# Patient Record
Sex: Female | Born: 1970
Health system: Southern US, Community
[De-identification: ages and names within clinical notes are randomized; demographics above are authoritative.]

## PROBLEM LIST (undated history)

## (undated) DIAGNOSIS — J3489 Other specified disorders of nose and nasal sinuses: Secondary | ICD-10-CM

## (undated) DIAGNOSIS — M797 Fibromyalgia: Secondary | ICD-10-CM

## (undated) DIAGNOSIS — K2270 Barrett's esophagus without dysplasia: Secondary | ICD-10-CM

## (undated) DIAGNOSIS — K2970 Gastritis, unspecified, without bleeding: Secondary | ICD-10-CM

## (undated) HISTORY — PX: WISDOM TOOTH EXTRACTION: SHX21

## (undated) HISTORY — PX: TONSILLECTOMY: SUR1361

## (undated) HISTORY — DX: Barrett's esophagus without dysplasia: K22.70

## (undated) HISTORY — DX: Fibromyalgia: M79.7

## (undated) HISTORY — DX: Other specified disorders of nose and nasal sinuses: J34.89

## (undated) HISTORY — PX: SEPTOPLASTY: SUR1290

## (undated) HISTORY — PX: HERNIA REPAIR: SHX51

---

## 2001-09-30 ENCOUNTER — Emergency Department (HOSPITAL_COMMUNITY): Admission: EM | Admit: 2001-09-30 | Discharge: 2001-10-01 | Payer: Self-pay | Admitting: Emergency Medicine

## 2003-04-08 ENCOUNTER — Emergency Department (HOSPITAL_COMMUNITY): Admission: AD | Admit: 2003-04-08 | Discharge: 2003-04-08 | Payer: Self-pay | Admitting: Emergency Medicine

## 2003-10-05 ENCOUNTER — Emergency Department (HOSPITAL_COMMUNITY): Admission: EM | Admit: 2003-10-05 | Discharge: 2003-10-05 | Payer: Self-pay | Admitting: Emergency Medicine

## 2003-12-20 ENCOUNTER — Emergency Department (HOSPITAL_COMMUNITY): Admission: EM | Admit: 2003-12-20 | Discharge: 2003-12-20 | Payer: Self-pay | Admitting: Emergency Medicine

## 2005-07-16 ENCOUNTER — Emergency Department (HOSPITAL_COMMUNITY): Admission: EM | Admit: 2005-07-16 | Discharge: 2005-07-16 | Payer: Self-pay | Admitting: Emergency Medicine

## 2005-07-19 ENCOUNTER — Encounter (HOSPITAL_COMMUNITY): Admission: RE | Admit: 2005-07-19 | Discharge: 2005-10-07 | Payer: Self-pay | Admitting: Emergency Medicine

## 2005-07-21 ENCOUNTER — Emergency Department (HOSPITAL_COMMUNITY): Admission: EM | Admit: 2005-07-21 | Discharge: 2005-07-21 | Payer: Self-pay | Admitting: Emergency Medicine

## 2005-11-12 ENCOUNTER — Ambulatory Visit (HOSPITAL_COMMUNITY): Admission: RE | Admit: 2005-11-12 | Discharge: 2005-11-12 | Payer: Self-pay | Admitting: *Deleted

## 2006-10-20 ENCOUNTER — Ambulatory Visit: Payer: Self-pay | Admitting: Psychiatry

## 2006-10-20 ENCOUNTER — Inpatient Hospital Stay (HOSPITAL_COMMUNITY): Admission: RE | Admit: 2006-10-20 | Discharge: 2006-10-25 | Payer: Self-pay | Admitting: Psychiatry

## 2006-11-05 ENCOUNTER — Inpatient Hospital Stay (HOSPITAL_COMMUNITY): Admission: AD | Admit: 2006-11-05 | Discharge: 2006-11-05 | Payer: Self-pay | Admitting: Family Medicine

## 2009-11-15 ENCOUNTER — Encounter: Admission: RE | Admit: 2009-11-15 | Discharge: 2009-11-15 | Payer: Self-pay | Admitting: Unknown Physician Specialty
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
## 2010-11-05 LAB — COMPREHENSIVE METABOLIC PANEL
BUN: 7
CO2: 30
Calcium: 9.5
GFR calc non Af Amer: >60
Glucose, Bld: 97
Total Protein: 6.9

## 2010-11-05 LAB — URINE DRUGS OF ABUSE SCREEN W ALC, ROUTINE (REF LAB)
Amphetamine Screen, Ur: NEGATIVE
Benzodiazepines.: NEGATIVE
Cocaine Metabolites: NEGATIVE
Ethyl Alcohol: <5
Marijuana Metabolite: NEGATIVE
Opiate Screen, Urine: NEGATIVE
Propoxyphene: NEGATIVE

## 2010-11-05 LAB — CBC
HCT: 33.1 — ABNORMAL LOW
HCT: 37.5
Hemoglobin: 11.6 — ABNORMAL LOW
Hemoglobin: 12.7
MCHC: 33.7
MCV: 96.3
RBC: 3.41 — ABNORMAL LOW
RBC: 3.9
RDW: 11.7
WBC: 5.5

## 2010-11-05 LAB — URINALYSIS, ROUTINE W REFLEX MICROSCOPIC
Bilirubin Urine: NEGATIVE
Glucose, UA: NEGATIVE
Hgb urine dipstick: NEGATIVE
Nitrite: NEGATIVE
Nitrite: NEGATIVE
Protein, ur: NEGATIVE
Protein, ur: NEGATIVE
Specific Gravity, Urine: 1.017
Urobilinogen, UA: 0.2
Urobilinogen, UA: 0.2

## 2010-11-05 LAB — GC/CHLAMYDIA PROBE AMP, GENITAL: Chlamydia, DNA Probe: NEGATIVE

## 2010-11-05 LAB — WET PREP, GENITAL

## 2010-11-05 LAB — PREGNANCY, URINE: Preg Test, Ur: NEGATIVE

## 2010-11-05 LAB — TSH: TSH: 0.603

## 2010-11-05 LAB — POCT PREGNANCY, URINE: Operator id: 202651

## 2011-01-26 ENCOUNTER — Ambulatory Visit: Payer: Self-pay

## 2011-01-26 DIAGNOSIS — R509 Fever, unspecified: Secondary | ICD-10-CM

## 2011-09-29 ENCOUNTER — Ambulatory Visit: Payer: Self-pay | Admitting: Physician Assistant

## 2011-09-29 VITALS — HR 94 | Temp 98.4°F | Resp 16 | Ht 59.0 in | Wt 102.0 lb

## 2011-09-29 DIAGNOSIS — J387 Other diseases of larynx: Secondary | ICD-10-CM

## 2011-09-29 DIAGNOSIS — J029 Acute pharyngitis, unspecified: Secondary | ICD-10-CM

## 2011-09-29 MED ORDER — DIPHENHYD-HYDROCORT-NYSTATIN MT SUSP: 5.0000 mL | Freq: Four times a day (QID) | OROMUCOSAL | Status: DC | PRN

## 2011-09-29 MED ORDER — AZITHROMYCIN 250 MG PO TABS: ORAL_TABLET | ORAL | Status: DC

## 2011-09-29 NOTE — Progress Notes (Signed)
Patient ID: Linda Mcbride MRN: [id number], DOB: 14-Nov-1970, 41 y.o. Date of Encounter: 09/29/2011, 3:40 PM  Primary Physician: No primary provider on file.  Chief Complaint:  Chief Complaint  Patient presents with  . Sore Throat    Started Monday  . Fever    HPI: 41 y.o. year old female presents with a 2 day history of sore throat. Subjective fever, chills, and myalgias. No cough, congestion, rhinorrhea, sinus pressure, otalgia, or headache. Normal hearing. No GI complaints. Able to swallow saliva, but hurts to do so. Decreased appetite secondary to sore throat. 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   No issues from her fibromyalgia. Has discontinued all of her medication.    Past Medical History  Diagnosis Date  . Fibromyalgia   . Perforated nasal septum      Home Meds: Prior to Admission medications   Not on File    Allergies:  Allergies  Allergen Reactions  . Penicillins Rash    History   Social History  . Marital Status: Single    Spouse Name: N/A    Number of Children: N/A  . Years of Education: N/A   Occupational History  . Not on file.   Social History Main Topics  . Smoking status: Never Smoker   . Smokeless tobacco: Not on file  . Alcohol Use: Not on file  . Drug Use: Not on file  . Sexually Active: Not on file   Other Topics Concern  . Not on file   Social History Narrative  . No narrative on file     Review of Systems: Constitutional: negative for night sweats or weight changes HEENT: see above Cardiovascular: negative for chest pain or palpitations Respiratory: negative for hemoptysis, wheezing, or shortness of breath Abdominal: negative for abdominal pain, nausea, vomiting or diarrhea Dermatological: negative for rash Neurologic: negative for headache   Physical Exam: Pulse 94, temperature 98.4 F (36.9 C), resp. rate 16, height 4\' 11"  (1.499 m), weight 102 lb (46.267 kg), last menstrual period 09/15/2011., Body mass  index is 20.60 kg/(m^2). Patient declines BP stating it causes a flare of her fibromyalgia. General: Well developed, well nourished, in no acute distress. Head: Normocephalic, atraumatic, eyes without discharge, sclera non-icteric, nares are patent. Bilateral auditory canals clear, TM's are without perforation, pearly grey with reflective cone of light bilaterally. No sinus TTP. Oral cavity moist, dentition normal. Posterior pharynx with post nasal drip, mild erythema, and an isolated ulcer along the right side without signs of secondary infection. No peritonsillar abscess or tonsillar exudate. Neck: Supple. No thyromegaly. Full ROM. Less than two cm AC. Lungs: Clear bilaterally to auscultation without wheezes, rales, or rhonchi. Breathing is unlabored. Heart: RRR with S1 S2. No murmurs, rubs, or gallops appreciated. Msk:  Strength and tone normal for age. Extremities: No clubbing or cyanosis. No edema. Neuro: Alert and oriented X 3. Moves all extremities spontaneously. CNII-XII grossly in tact. Psych:  Responds to questions appropriately with a normal affect.   Labs: Results for orders placed in visit on 09/29/11  POCT RAPID STREP A (OFFICE)      Component Value Range   Rapid Strep A Screen Negative  Negative     ASSESSMENT AND PLAN:  41 y.o. year old female with ulcerative pharyngitis. -Azithromycin 250 MG #6 2 po first day then 1 po next 4 days no RF -[employer] Magic Mouthwash #120 mL 1 tsp po q 4-6 hours prn sore throat no RF -New tooth brush -  Tylenol/Motrin prn -Rest/fluids -RTC precautions -RTC 3-5 days if no improvement  Signed, Eula Listen, PA-C 09/29/2011 3:40 PM

## 2012-04-09 ENCOUNTER — Ambulatory Visit: Payer: Self-pay | Admitting: Family Medicine

## 2012-04-09 VITALS — HR 84 | Temp 98.2°F | Resp 16 | Ht 59.0 in | Wt 101.0 lb

## 2012-04-09 DIAGNOSIS — J019 Acute sinusitis, unspecified: Secondary | ICD-10-CM

## 2012-04-09 DIAGNOSIS — J387 Other diseases of larynx: Secondary | ICD-10-CM

## 2012-04-09 DIAGNOSIS — J029 Acute pharyngitis, unspecified: Secondary | ICD-10-CM

## 2012-04-09 MED ORDER — AZITHROMYCIN 250 MG PO TABS: ORAL_TABLET | ORAL | Status: AC

## 2012-04-09 NOTE — Progress Notes (Signed)
Patient ID: Linda Mcbride MRN: [id number], DOB: [birth year], 42 y.o. Date of Encounter: 04/09/2012, 3:05 PM  Primary Physician: No primary provider on file.  Chief Complaint:  Chief Complaint  Patient presents with  . Sinusitis    * about 1 month- Pt took Clindamyacin for 1 week- Dx at [facility]    HPI: 42 y.o. year old female presents with 30 day history of nasal congestion, post nasal drip, sore throat, sinus pressure, and cough. Afebrile. No chills. Nasal congestion thick and green/yellow. Sinus pressure is the worst symptom. Cough is productive secondary to post nasal drip and not associated with time of day. Ears feel full, leading to sensation of muffled hearing. Has tried OTC cold preps without success. No GI complaints.  No recent antibiotics, recent travels, or sick contacts   No leg trauma, sedentary periods, h/o cancer, or tobacco use.  Past Medical History  Diagnosis Date  . Fibromyalgia   . Perforated nasal septum      Home Meds: Prior to Admission medications   Medication Sig Start Date End Date Taking? Authorizing Provider  azithromycin (ZITHROMAX Z-PAK) 250 MG tablet 2 tabs po first day, then 1 tab po next 4 days 04/09/12 04/14/12  Elvina Sidle, MD  Diphenhyd-Hydrocort-Nystatin SUSP Use as directed 5 mLs in the mouth or throat 4 (four) times daily as needed (sore throat). 09/29/11   Sondra Barges, PA-C    Allergies:  Allergies  Allergen Reactions  . Sulfa Antibiotics Itching and Nausea And Vomiting  . Penicillins Rash    History   Social History  . Marital Status: Single    Spouse Name: N/A    Number of Children: N/A  . Years of Education: N/A   Occupational History  . Not on file.   Social History Main Topics  . Smoking status: Never Smoker   . Smokeless tobacco: Not on file  . Alcohol Use: No  . Drug Use: No  . Sexually Active: Not on file   Other Topics Concern  . Not on file   Social History Narrative  . No narrative on file      Review of Systems: Constitutional: negative for chills, fever, night sweats or weight changes Cardiovascular: negative for chest pain or palpitations Respiratory: negative for hemoptysis, wheezing, or shortness of breath Abdominal: negative for abdominal pain, nausea, vomiting or diarrhea Dermatological: negative for rash Neurologic: negative for headache   Physical Exam: Blood pressure , pulse 84, temperature 98.2 F (36.8 C), temperature source Oral, resp. rate 16, height 4\' 11"  (1.499 m), weight 101 lb (45.813 kg), last menstrual period 04/02/2012, SpO2 96.00%., Body mass index is 20.39 kg/(m^2). General: Well developed, well nourished, in no acute distress. Head: Normocephalic, atraumatic, eyes without discharge, sclera non-icteric, nares are congested. Bilateral auditory canals clear, TM's are without perforation, pearly grey with reflective cone of light bilaterally. Serous effusion bilaterally behind TM's. Maxillary sinus TTP. Oral cavity moist, dentition normal. Posterior pharynx with post nasal drip and mild erythema. No peritonsillar abscess or tonsillar exudate. Neck: Supple. No thyromegaly. Full ROM. No lymphadenopathy. Lungs: Clear bilaterally to auscultation without wheezes, rales, or rhonchi. Breathing is unlabored.  Heart: RRR with S1 S2. No murmurs, rubs, or gallops appreciated. Msk:  Strength and tone normal for age. Extremities: No clubbing or cyanosis. No edema. Neuro: Alert and oriented X 3. Moves all extremities spontaneously. CNII-XII grossly in tact. Psych:  Responds to questions appropriately with a normal affect.   Labs:   ASSESSMENT AND PLAN:  42 y.o. year old female with sinusitis  Acute sinusitis - Plan: azithromycin (ZITHROMAX Z-PAK) 250 MG tablet   .  -  -Tylenol/Motrin prn -Rest/fluids -RTC precautions -RTC 3-5 days if no improvement  Signed, Elvina Sidle, MD 04/09/2012 3:05 PM

## 2013-03-23 ENCOUNTER — Ambulatory Visit: Payer: Self-pay | Admitting: Physician Assistant

## 2013-03-23 VITALS — BP 122/74 | HR 71 | Temp 98.4°F | Resp 17 | Ht 60.5 in | Wt 107.0 lb

## 2013-03-23 DIAGNOSIS — R309 Painful micturition, unspecified: Secondary | ICD-10-CM

## 2013-03-23 DIAGNOSIS — R300 Dysuria: Secondary | ICD-10-CM

## 2013-03-23 LAB — POCT UA - MICROSCOPIC ONLY
Casts, Ur, LPF, POC: NEGATIVE
Crystals, Ur, HPF, POC: NEGATIVE
Epithelial cells, urine per micros: NEGATIVE
Mucus, UA: POSITIVE
Yeast, UA: NEGATIVE

## 2013-03-23 LAB — POCT URINALYSIS DIPSTICK
BILIRUBIN UA: NEGATIVE
GLUCOSE UA: NEGATIVE
KETONES UA: NEGATIVE
Nitrite, UA: NEGATIVE
Protein, UA: NEGATIVE
SPEC GRAV UA: 1.015
Urobilinogen, UA: 0.2
pH, UA: 8.5

## 2013-03-23 MED ORDER — CIPROFLOXACIN HCL 250 MG PO TABS: 250.0000 mg | ORAL_TABLET | Freq: Two times a day (BID) | ORAL | Status: AC

## 2013-03-23 NOTE — Progress Notes (Signed)
Subjective:    Patient ID: Linda Mcbride, female    DOB: 1970/07/31, 43 y.o.   MRN: [id number]  HPI Patient reports with one day history of sharp stabbing suprapubic pain.  She reports the pain started last night and is getting worse.  The pain woke her up during the night.  She states the pain is constant.  She reports trouble getting her stream to start, having to urinate frequently, feeling like her bladder is not emptying.  She reports a strong urine odor for 2 days and states she has not been able to urinate as frequently as needed this week due to work.  She denies any vaginal discharge, odor or burning.   Review of Systems  Constitutional: Positive for fatigue (due to lack of sleep last night). Negative for fever and chills.  Gastrointestinal: Positive for abdominal pain (suprapubic pain, sharp/stabbing, constant). Negative for nausea, vomiting and abdominal distention.  Genitourinary: Positive for urgency, frequency and difficulty urinating. Negative for dysuria, flank pain, decreased urine volume, vaginal bleeding, vaginal discharge, vaginal pain and menstrual problem.     Objective:   Physical Exam  Constitutional: She is oriented to person, place, and time. She appears well-developed and well-nourished. No distress.  HENT:  Head: Normocephalic and atraumatic.  Eyes: Conjunctivae and EOM are normal. Pupils are equal, round, and reactive to light. Right eye exhibits no discharge. Left eye exhibits no discharge. No scleral icterus.  Neck: Normal range of motion.  Cardiovascular: Normal rate, regular rhythm and normal heart sounds.  Exam reveals no gallop and no friction rub.   No murmur heard. Pulmonary/Chest: Effort normal and breath sounds normal.  Abdominal: Soft. Bowel sounds are normal. She exhibits no distension. There is tenderness (suprapubic plus mild diffuse abdominal). There is no CVA tenderness.  Neurological: She is alert and oriented to person, place, and time.  Skin:  Skin is warm and dry.  Psychiatric: She has a normal mood and affect. Her behavior is normal. Judgment and thought content normal.      Results for orders placed in visit on 03/23/13  POCT UA - MICROSCOPIC ONLY      Result Value Ref Range   WBC, Ur, HPF, POC 7-10     RBC, urine, microscopic 1-3     Bacteria, U Microscopic trace     Mucus, UA pos     Epithelial cells, urine per micros neg     Crystals, Ur, HPF, POC neg     Casts, Ur, LPF, POC neg     Yeast, UA neg    POCT URINALYSIS DIPSTICK      Result Value Ref Range   Color, UA light yellow     Clarity, UA clear     Glucose, UA neg     Bilirubin, UA neg     Ketones, UA neg     Spec Grav, UA 1.015     Blood, UA small     pH, UA 8.5     Protein, UA neg     Urobilinogen, UA 0.2     Nitrite, UA neg     Leukocytes, UA small (1+)      Assessment & Plan:   1. Dysuria - Suspect UTI, early, empiric therapy initiated, will await culture  - POCT UA - Microscopic Only - POCT urinalysis dipstick - Urine culture - ciprofloxacin (CIPRO) 250 MG tablet; Take 1 tablet (250 mg total) by mouth 2 (two) times daily.  Dispense: 10 tablet; Refill: 0

## 2013-03-23 NOTE — Patient Instructions (Signed)
Urinary Tract Infection  Urinary tract infections (UTIs) can develop anywhere along your urinary tract. Your urinary tract is your body's drainage system for removing wastes and extra water. Your urinary tract includes two kidneys, two ureters, a bladder, and a urethra. Your kidneys are a pair of bean-shaped organs. Each kidney is about the size of your fist. They are located below your ribs, one on each side of your spine.  CAUSES  Infections are caused by microbes, which are microscopic organisms, including fungi, viruses, and bacteria. These organisms are so small that they can only be seen through a microscope. Bacteria are the microbes that most commonly cause UTIs.  SYMPTOMS   Symptoms of UTIs may vary by age and gender of the patient and by the location of the infection. Symptoms in young women typically include a frequent and intense urge to urinate and a painful, burning feeling in the bladder or urethra during urination. Older women and men are more likely to be tired, shaky, and weak and have muscle aches and abdominal pain. A fever may mean the infection is in your kidneys. Other symptoms of a kidney infection include pain in your back or sides below the ribs, nausea, and vomiting.  DIAGNOSIS  To diagnose a UTI, your caregiver will ask you about your symptoms. Your caregiver also will ask to provide a urine sample. The urine sample will be tested for bacteria and white blood cells. White blood cells are made by your body to help fight infection.  TREATMENT   Typically, UTIs can be treated with medication. Because most UTIs are caused by a bacterial infection, they usually can be treated with the use of antibiotics. The choice of antibiotic and length of treatment depend on your symptoms and the type of bacteria causing your infection.  HOME CARE INSTRUCTIONS   If you were prescribed antibiotics, take them exactly as your caregiver instructs you. Finish the medication even if you feel better after you  have only taken some of the medication.   Drink enough water and fluids to keep your urine clear or pale yellow.   Avoid caffeine, tea, and carbonated beverages. They tend to irritate your bladder.   Empty your bladder often. Avoid holding urine for long periods of time.   Empty your bladder before and after sexual intercourse.   After a bowel movement, women should cleanse from front to back. Use each tissue only once.  SEEK MEDICAL CARE IF:    You have back pain.   You develop a fever.   Your symptoms do not begin to resolve within 3 days.  SEEK IMMEDIATE MEDICAL CARE IF:    You have severe back pain or lower abdominal pain.   You develop chills.   You have nausea or vomiting.   You have continued burning or discomfort with urination.  MAKE SURE YOU:    Understand these instructions.   Will watch your condition.   Will get help right away if you are not doing well or get worse.  Document Released: 10/21/2004 Document Revised: 07/13/2011 Document Reviewed: 02/19/2011  Exit[employer] Patient Information 2014 [employer].

## 2013-03-23 NOTE — Progress Notes (Signed)
I have examined this patient along with the student and agree.  

## 2013-03-26 LAB — URINE CULTURE

## 2013-06-08 ENCOUNTER — Ambulatory Visit: Payer: Self-pay | Admitting: Family Medicine

## 2013-06-08 VITALS — BP 108/70 | HR 73 | Temp 97.9°F | Resp 16

## 2013-06-08 DIAGNOSIS — H9209 Otalgia, unspecified ear: Secondary | ICD-10-CM

## 2013-06-08 DIAGNOSIS — Z831 Family history of other infectious and parasitic diseases: Secondary | ICD-10-CM

## 2013-06-08 DIAGNOSIS — R87619 Unspecified abnormal cytological findings in specimens from cervix uteri: Secondary | ICD-10-CM

## 2013-06-08 DIAGNOSIS — N949 Unspecified condition associated with female genital organs and menstrual cycle: Secondary | ICD-10-CM

## 2013-06-08 DIAGNOSIS — Z124 Encounter for screening for malignant neoplasm of cervix: Secondary | ICD-10-CM

## 2013-06-08 DIAGNOSIS — R102 Pelvic and perineal pain: Secondary | ICD-10-CM

## 2013-06-08 LAB — POCT URINALYSIS DIPSTICK
BILIRUBIN UA: NEGATIVE
Blood, UA: NEGATIVE
Glucose, UA: NEGATIVE
KETONES UA: NEGATIVE
LEUKOCYTES UA: NEGATIVE
NITRITE UA: NEGATIVE
PROTEIN UA: NEGATIVE
Spec Grav, UA: 1.015
Urobilinogen, UA: 0.2
pH, UA: 7.5

## 2013-06-08 LAB — POCT UA - MICROSCOPIC ONLY
Bacteria, U Microscopic: NEGATIVE
Casts, Ur, LPF, POC: NEGATIVE
Crystals, Ur, HPF, POC: NEGATIVE
Mucus, UA: NEGATIVE
RBC, URINE, MICROSCOPIC: NEGATIVE
WBC, UR, HPF, POC: NEGATIVE
Yeast, UA: NEGATIVE

## 2013-06-08 MED ORDER — ACYCLOVIR 400 MG PO TABS: 400.0000 mg | ORAL_TABLET | Freq: Three times a day (TID) | ORAL | Status: DC

## 2013-06-08 NOTE — Patient Instructions (Signed)
You can use the acyclovir as needed for genital herpes symptoms.   I will be in touch with your pap when it comes in Please try OTC medications for your cold- such as ibuprofen, mucinex, tylenol.    Please let me know if you have any other problems or are feeling worse.

## 2013-06-08 NOTE — Progress Notes (Addendum)
[facility] [address], [place] [address] [phone]  Date:  06/08/2013   Name:  Linda DukesMayumi Mcbride   DOB:  [birth year]   MRN:  [id number]  PCP:  No primary provider on file.    Chief Complaint: Urinary Frequency, Otalgia and Herpes Zoster   History of Present Illness:  Linda Mcbride is a 43 y.o. very pleasant female patient who presents with the following:  Last week she noted pain "in the pelvic area."  She notes this pain "all the time," and also with urination. She also notes a current genital HSV outbreak.  She has noted the outbreak towards the end of last week when she had her menses. Notes that she tends to "get sores with wearing pads."  States she was dx with HSV some years ago and noted typical sores recently.   She also notes "plugged ears, dizziness, ST, some coughing in the morning" for about 4 days.   She has not noted a fever. No GI symptoms.  However she had a hard time sleeping last night because she "felt hot" Her menses are just ending.    There are no active problems to display for this patient.   Past Medical History  Diagnosis Date  . Fibromyalgia   . Perforated nasal septum     Past Surgical History  Procedure Laterality Date  . Septoplasty    . Hernia repair      History  Substance Use Topics  . Smoking status: Never Smoker   . Smokeless tobacco: Not on file  . Alcohol Use: No    No family history on file.  Allergies  Allergen Reactions  . Clindamycin/Lincomycin Itching  . Sulfa Antibiotics Itching and Nausea And Vomiting  . Penicillins Rash    Medication list has been reviewed and updated.  No current outpatient prescriptions on file prior to visit.   No current facility-administered medications on file prior to visit.    Review of Systems:  As per HPI- otherwise negative.   Physical Examination: Filed Vitals:   06/08/13 0908  BP: 108/70  Pulse: 73  Temp: 97.9 F (36.6 C)  Resp: 16   Filed  Vitals:   Body mass index is 0.00 kg/(m^2). Ideal Body Weight:    GEN: WDWN, NAD, Non-toxic, A & O x 3, looks well.   HEENT: Atraumatic, Normocephalic. Neck supple. No masses, No LAD.  Bilateral TM wnl, oropharynx normal.  PEERL,EOMI. She has a nasal septum perforation  Ears and Nose: No external deformity. CV: RRR, No M/G/R. No JVD. No thrill. No extra heart sounds. PULM: CTA B, no wheezes, crackles, rhonchi. No retractions. No resp. distress. No accessory muscle use. ABD: S, NT, ND. No rebound. No HSM. EXTR: No c/c/e NEURO Normal gait.  PSYCH: Normally interactive. Conversant. Somewhat unusual affect.  Chart reviews reveals history of a psychotic disorder NOS with hospitalization in the pasts Gu: normal exam.  No masses, lesions or vaginal discharge. No evidence of HSV lesions on the vulva, vagina, perineum or buttocks.  No CMT, no adnexal masses or tenderness   Results for orders placed in visit on 06/08/13  POCT UA - MICROSCOPIC ONLY      Result Value Ref Range   WBC, Ur, HPF, POC neg     RBC, urine, microscopic neg     Bacteria, U Microscopic neg     Mucus, UA neg     Epithelial cells, urine per micros 0-1     Crystals, Ur,  HPF, POC neg     Casts, Ur, LPF, POC neg     Yeast, UA neg    POCT URINALYSIS DIPSTICK      Result Value Ref Range   Color, UA yellow     Clarity, UA clear     Glucose, UA neg     Bilirubin, UA neg     Ketones, UA neg     Spec Grav, UA 1.015     Blood, UA neg     pH, UA 7.5     Protein, UA neg     Urobilinogen, UA 0.2     Nitrite, UA neg     Leukocytes, UA Negative     Assessment and Plan: Screening for cervical cancer - Plan: Pap IG, CT/NG w/ reflex HPV when ASC-U  Pelvic pain in female - Plan: POCT UA - Microscopic Only, POCT urinalysis dipstick  Ear pain  Family history of herpes genitalis - Plan: acyclovir (ZOVIRAX) 400 MG tablet  Linda Mcbride is here today with a few concerns.  Reassured that I actually do not see any evidence of an active  HSV outbreak.  However she may certainly use acyclovir for 5 days if she would like.  Await pap #3.   Otherwise she seems to have a viral URI.  Encouraged her to use OTC medications as needed, and to let me know if not feeling better soon.    Signed Abbe AmsterdamJessica Kanda Deluna, MD  Called her on 5/20- let her know she has an abnormal pap and needs further eval per OBG.  I will make this referral for her, she will let me know if she does not hear

## 2013-06-12 LAB — PAP IG, CT-NG, RFX HPV ASCU
Chlamydia Probe Amp: NEGATIVE
GC Probe Amp: NEGATIVE

## 2013-06-13 ENCOUNTER — Encounter: Payer: Self-pay | Admitting: Family Medicine

## 2013-06-13 NOTE — Addendum Note (Signed)
Addended by: Abbe AmsterdamOPLAND, JESSICA C on: 06/13/2013 12:41 PM   Modules accepted: Orders

## 2013-07-20 ENCOUNTER — Encounter: Payer: Self-pay | Admitting: Obstetrics & Gynecology

## 2013-08-30 ENCOUNTER — Encounter: Payer: Self-pay | Admitting: Obstetrics & Gynecology

## 2013-08-30 ENCOUNTER — Other Ambulatory Visit: Payer: Self-pay | Admitting: Obstetrics and Gynecology

## 2013-08-30 DIAGNOSIS — Z1231 Encounter for screening mammogram for malignant neoplasm of breast: Secondary | ICD-10-CM

## 2013-09-04 ENCOUNTER — Ambulatory Visit (HOSPITAL_COMMUNITY)
Admission: RE | Admit: 2013-09-04 | Discharge: 2013-09-04 | Disposition: A | Discharge status: Home / Self Care | Payer: Self-pay | Source: Ambulatory Visit | Attending: Obstetrics and Gynecology | Admitting: Obstetrics and Gynecology

## 2013-09-04 ENCOUNTER — Encounter (HOSPITAL_COMMUNITY): Payer: Self-pay

## 2013-09-04 VITALS — BP 110/68 | Temp 98.8°F | Ht 59.0 in | Wt 103.2 lb

## 2013-09-04 DIAGNOSIS — Z1239 Encounter for other screening for malignant neoplasm of breast: Secondary | ICD-10-CM

## 2013-09-04 DIAGNOSIS — Z1231 Encounter for screening mammogram for malignant neoplasm of breast: Secondary | ICD-10-CM

## 2013-09-04 DIAGNOSIS — R87612 Low grade squamous intraepithelial lesion on cytologic smear of cervix (LGSIL): Secondary | ICD-10-CM | POA: Insufficient documentation

## 2013-09-04 NOTE — Patient Instructions (Signed)
Linda Mcbride did not need a Pap smear today due to last Pap smear was 06/08/2013. [facility] for col[facility] to follow-up for abnormal Pap smear. Appointment scheduled for Thursday, October 18, 2013 at 1315. Patient aware of appointment and will be there. Let patient know the [facility] will follow up with her within the next couple weeks with results by letter or phone. Linda Mcbride verbalized understanding. Patient escorted to mammography for a screening mammogram.  Frederick Klinger, Kathaleen Maserhristine Poll, RN 3:54 PM

## 2013-09-04 NOTE — Progress Notes (Signed)
Patient referred to [facility] by the [facility] due to recommending a colposcopy for an abnormal Pap smear on 06/08/2013.  Pap Smear:  Pap smear not completed today. Last Pap smear was 06/08/2013 at [facility]. [facility] to follow-up for abnormal Pap smear. Appointment scheduled for Thursday, October 18, 2013 at 1315. Per patient this is her first abnormal Pap smear. Last Pap smear result is in EPIC.  Physical exam: Breasts Breasts symmetrical. No skin abnormalities bilateral breasts. No nipple retraction bilateral breasts. No nipple discharge bilateral breasts. No lymphadenopathy. No lumps palpated bilateral breasts. No complaints of pain or tenderness on exam. Patient escorted to mammography for a screening mammogram.        Pelvic/Bimanual No Pap smear completed today since last Pap smear was 06/08/2013. Pap smear not indicated per [facility]CP guidelines.

## 2013-10-18 ENCOUNTER — Encounter: Payer: Self-pay | Admitting: Family Medicine

## 2013-10-19 ENCOUNTER — Ambulatory Visit: Payer: Self-pay | Attending: Internal Medicine

## 2013-12-07 ENCOUNTER — Encounter: Payer: Self-pay | Admitting: Nurse Practitioner

## 2014-01-02 ENCOUNTER — Encounter: Payer: Self-pay | Admitting: Family Medicine

## 2014-01-02 ENCOUNTER — Ambulatory Visit (INDEPENDENT_AMBULATORY_CARE_PROVIDER_SITE_OTHER): Payer: Self-pay | Admitting: Family Medicine

## 2014-01-02 VITALS — BP 88/58 | HR 86 | Temp 98.8°F | Resp 16 | Ht 59.5 in | Wt 101.0 lb

## 2014-01-02 DIAGNOSIS — J0100 Acute maxillary sinusitis, unspecified: Secondary | ICD-10-CM

## 2014-01-02 MED ORDER — AZITHROMYCIN 250 MG PO TABS: ORAL_TABLET | ORAL | Status: DC

## 2014-01-02 NOTE — Progress Notes (Signed)
   Subjective:    Patient ID: Linda DukesMayumi Torrance, female    DOB: [birth year], 43 y.o.   MRN: [id number]  HPI This is a 43 yo female accompanied by her husband. The patient presents today with 3 week history of intermittent upper respiratory symptoms. Started as sore throat, fever, muscle aches. Felt better after a few days. The next week, got better, then became sick again. One week ago, she began having yellow nasal drainage with blood. She has a non productive cough with tightness. She has a sinus headache over and below her left eye and ear pressure. She reports having a sinus infection every year and having good results with Zithromax. She is unable to tolerate any nasal sprays other than saline. She can not tolerate Mucinex due to coloring.  Lots of people at their workplace have been sick with similar symptoms in a similar recurring pattern.   The patient reports multiple intolerances to dyes, disinfectants and additives in medication. She has been taking an over the counter natural decongestant that she purchased at [employer] with some relief. Does not feel that analgesics work for her pain, therefore, she has not taken any ibuprofen or acetaminophen.   Review of Systems No recent fever, no disrupted sleep, no wheezing, No SOB, feels unable to take a deep breath, has sinus headache, left ear pressure,     Objective:   Physical Exam  Constitutional: She is oriented to person, place, and time. She appears well-developed and well-nourished. No distress.  HENT:  Head: Normocephalic and atraumatic.  Right Ear: Tympanic membrane, external ear and ear canal normal.  Left Ear: Tympanic membrane, external ear and ear canal normal.  Nose: Mucosal edema and rhinorrhea present. Right sinus exhibits maxillary sinus tenderness. Right sinus exhibits no frontal sinus tenderness. Left sinus exhibits maxillary sinus tenderness and frontal sinus tenderness.  Mouth/Throat: Uvula is midline and mucous membranes are  normal. Posterior oropharyngeal erythema present.  Thick post nasal drainage.  Eyes: Conjunctivae are normal. Pupils are equal, round, and reactive to light.  Neck: Normal range of motion. Neck supple.  Cardiovascular: Normal rate, regular rhythm and normal heart sounds.   Pulmonary/Chest: Effort normal and breath sounds normal. No respiratory distress. She has no wheezes. She has no rales. She exhibits no tenderness.  Musculoskeletal: Normal range of motion.  Neurological: She is alert and oriented to person, place, and time.  Skin: Skin is warm and dry. She is not diaphoretic.  Psychiatric: She has a normal mood and affect. Her behavior is normal. Judgment and thought content normal.  Vitals reviewed. BP 88/58 mmHg  Pulse 86  Temp(Src) 98.8 F (37.1 C) (Oral)  Resp 16  Ht 4' 11.5" (1.511 m)  Wt 101 lb (45.813 kg)  BMI 20.07 kg/m2  SpO2 98%  LMP 12/16/2013 Repeat BP- 102/62    Assessment & Plan:  1. Acute maxillary sinusitis, recurrence not specified - azithromycin (ZITHROMAX) 250 MG tablet; Take 2 tablets today, then 1 every day until finished.  Dispense: 6 tablet; Refill: 0 -Provided written and verbal information regarding diagnosis and treatment. - Patient very limited in what she can take for symptomatic treatment due to her intolerances. Recommended she continue OTC decongestant, saline nasal spray, increase fluids, warm compresses to face. - RTC if worsening symptoms or if not improved with antibiotic Emi Belfasteborah B. Gessner, FNP-BC  [facility]  01/02/2014 3:30 PM

## 2014-01-02 NOTE — Patient Instructions (Signed)
Continue decongestant Push fluids Warm compresses and saline spray  Sinusitis Sinusitis is redness, soreness, and inflammation of the paranasal sinuses. Paranasal sinuses are air pockets within the bones of your face (beneath the eyes, the middle of the forehead, or above the eyes). In healthy paranasal sinuses, mucus is able to drain out, and air is able to circulate through them by way of your nose. However, when your paranasal sinuses are inflamed, mucus and air can become trapped. This can allow bacteria and other germs to grow and cause infection. Sinusitis can develop quickly and last only a short time (acute) or continue over a long period (chronic). Sinusitis that lasts for more than 12 weeks is considered chronic.  CAUSES  Causes of sinusitis include:  Allergies.  Structural abnormalities, such as displacement of the cartilage that separates your nostrils (deviated septum), which can decrease the air flow through your nose and sinuses and affect sinus drainage.  Functional abnormalities, such as when the small hairs (cilia) that line your sinuses and help remove mucus do not work properly or are not present. SIGNS AND SYMPTOMS  Symptoms of acute and chronic sinusitis are the same. The primary symptoms are pain and pressure around the affected sinuses. Other symptoms include:  Upper toothache.  Earache.  Headache.  Bad breath.  Decreased sense of smell and taste.  A cough, which worsens when you are lying flat.  Fatigue.  Fever.  Thick drainage from your nose, which often is green and may contain pus (purulent).  Swelling and warmth over the affected sinuses. DIAGNOSIS  Your health care provider will perform a physical exam. During the exam, your health care provider may:  Look in your nose for signs of abnormal growths in your nostrils (nasal polyps).  Tap over the affected sinus to check for signs of infection.  View the inside of your sinuses (endoscopy) using  an imaging device that has a light attached (endoscope). If your health care provider suspects that you have chronic sinusitis, one or more of the following tests may be recommended:  Allergy tests.  Nasal culture. A sample of mucus is taken from your nose, sent to a lab, and screened for bacteria.  Nasal cytology. A sample of mucus is taken from your nose and examined by your health care provider to determine if your sinusitis is related to an allergy. TREATMENT  Most cases of acute sinusitis are related to a viral infection and will resolve on their own within 10 days. Sometimes medicines are prescribed to help relieve symptoms (pain medicine, decongestants, nasal steroid sprays, or saline sprays).  However, for sinusitis related to a bacterial infection, your health care provider will prescribe antibiotic medicines. These are medicines that will help kill the bacteria causing the infection.  Rarely, sinusitis is caused by a fungal infection. In theses cases, your health care provider will prescribe antifungal medicine. For some cases of chronic sinusitis, surgery is needed. Generally, these are cases in which sinusitis recurs more than 3 times per year, despite other treatments. HOME CARE INSTRUCTIONS   Drink plenty of water. Water helps thin the mucus so your sinuses can drain more easily.  Use a humidifier.  Inhale steam 3 to 4 times a day (for example, sit in the bathroom with the shower running).  Apply a warm, moist washcloth to your face 3 to 4 times a day, or as directed by your health care provider.  Use saline nasal sprays to help moisten and clean your sinuses.  Take  medicines only as directed by your health care provider.  If you were prescribed either an antibiotic or antifungal medicine, finish it all even if you start to feel better. SEEK IMMEDIATE MEDICAL CARE IF:  You have increasing pain or severe headaches.  You have nausea, vomiting, or drowsiness.  You have  swelling around your face.  You have vision problems.  You have a stiff neck.  You have difficulty breathing. MAKE SURE YOU:   Understand these instructions.  Will watch your condition.  Will get help right away if you are not doing well or get worse. Document Released: 01/11/2005 Document Revised: 05/28/2013 Document Reviewed: 01/26/2011 [facility] Patient Information 2015 [place], [place]. This information is not intended to replace advice given to you by your health care provider. Make sure you discuss any questions you have with your health care provider.

## 2014-02-22 ENCOUNTER — Ambulatory Visit (INDEPENDENT_AMBULATORY_CARE_PROVIDER_SITE_OTHER): Payer: Self-pay | Admitting: Emergency Medicine

## 2014-02-22 VITALS — BP 108/70 | HR 81 | Temp 98.3°F | Resp 16 | Ht 60.5 in | Wt 103.2 lb

## 2014-02-22 DIAGNOSIS — J0140 Acute pansinusitis, unspecified: Secondary | ICD-10-CM

## 2014-02-22 MED ORDER — TRIAMCINOLONE ACETONIDE 55 MCG/ACT NA AERO: 4.0000 | INHALATION_SPRAY | Freq: Every day | NASAL | Status: DC

## 2014-02-22 MED ORDER — PSEUDOEPHEDRINE-GUAIFENESIN ER 60-600 MG PO TB12: 1.0000 | ORAL_TABLET | Freq: Two times a day (BID) | ORAL | Status: DC

## 2014-02-22 MED ORDER — LEVOFLOXACIN 500 MG PO TABS: 500.0000 mg | ORAL_TABLET | Freq: Every day | ORAL | Status: DC

## 2014-02-22 MED ORDER — HYDROCOD POLST-CHLORPHEN POLST 10-8 MG/5ML PO LQCR: 5.0000 mL | Freq: Two times a day (BID) | ORAL | Status: DC | PRN

## 2014-02-22 NOTE — Progress Notes (Signed)
[facility] [address], [place] [address] [phone]  Date:  02/22/2014   Name:  Linda DukesMayumi Mcbride   DOB:  [birth year]   MRN:  [id number]  PCP:  No PCP Per Patient    Chief Complaint: Facial Pain; Nasal Congestion; Fatigue; and Dizziness   History of Present Illness:  Linda Mcbride is a 44 y.o. very pleasant female patient who presents with the following:  Has nasal congestion and drainage that is mucopurulent.   Headache on left side. Post nasal drainage Sore throat. Cough that is minimally productive worse at night. No wheezing or shortness of breath No fever or chills No improvement with over the counter medications or other home remedies.  Denies other complaint or health concern today.   Patient Active Problem List   Diagnosis Date Noted  . Low grade squamous intraepithelial lesion (LGSIL) on cervical Pap smear 09/04/2013    Past Medical History  Diagnosis Date  . Fibromyalgia   . Perforated nasal septum   . Fibromyalgia     Past Surgical History  Procedure Laterality Date  . Septoplasty    . Hernia repair    . Wisdom tooth extraction    . Tonsillectomy      History  Substance Use Topics  . Smoking status: Never Smoker   . Smokeless tobacco: Not on file  . Alcohol Use: No    Family History  Problem Relation Age of Onset  . Cancer Maternal Grandmother     lung, thyroid  . Cancer Maternal Grandfather     lung and thyroid    Allergies  Allergen Reactions  . Clindamycin/Lincomycin Itching  . Sulfa Antibiotics Itching and Nausea And Vomiting  . Penicillins Rash    Medication list has been reviewed and updated.  Current Outpatient Prescriptions on File Prior to Visit  Medication Sig Dispense Refill  . acyclovir (ZOVIRAX) 400 MG tablet Take 1 tablet (400 mg total) by mouth 3 (three) times daily. Take for 5 days as needed for outbreak (Patient not taking: Reported on 02/22/2014) 45 tablet 0  . azithromycin (ZITHROMAX) 250 MG  tablet Take 2 tablets today, then 1 every day until finished. (Patient not taking: Reported on 02/22/2014) 6 tablet 0   No current facility-administered medications on file prior to visit.    Review of Systems:  As per HPI, otherwise negative.    Physical Examination: Filed Vitals:   02/22/14 1704  BP: 108/70  Pulse: 81  Temp: 98.3 F (36.8 C)  Resp: 16   Filed Vitals:   02/22/14 1704  Height: 5' 0.5" (1.537 m)  Weight: 103 lb 3.2 oz (46.811 kg)   Body mass index is 19.82 kg/(m^2). Ideal Body Weight: Weight in (lb) to have BMI = 25: 129.9  GEN: WDWN, NAD, Non-toxic, A & O x 3 HEENT: Atraumatic, Normocephalic. Neck supple. No masses, No LAD. Ears and Nose: No external deformity.  Moderate congestion CV: RRR, No M/G/R. No JVD. No thrill. No extra heart sounds. PULM: CTA B, no wheezes, crackles, rhonchi. No retractions. No resp. distress. No accessory muscle use. ABD: S, NT, ND, +BS. No rebound. No HSM. EXTR: No c/c/e NEURO Normal gait.  PSYCH: Normally interactive. Conversant. Not depressed or anxious appearing.  Calm demeanor.    Assessment and Plan: Acute sinusitis cefzil mucinex nasacort  Signed,  Phillips OdorJeffery Enrica Corliss, MD

## 2014-02-22 NOTE — Patient Instructions (Signed)

## 2014-04-09 ENCOUNTER — Telehealth (HOSPITAL_COMMUNITY): Payer: Self-pay | Admitting: *Deleted

## 2014-04-09 NOTE — Telephone Encounter (Signed)
Attempted to call patient to remind to schedule follow-up colposcopy. No one answered phone. Left voicemail for patient to call me back.

## 2014-08-06 ENCOUNTER — Ambulatory Visit (INDEPENDENT_AMBULATORY_CARE_PROVIDER_SITE_OTHER): Payer: Self-pay | Admitting: Family Medicine

## 2014-08-06 VITALS — BP 100/80 | HR 74 | Temp 98.6°F | Resp 18 | Ht 60.0 in | Wt 102.0 lb

## 2014-08-06 DIAGNOSIS — A6000 Herpesviral infection of urogenital system, unspecified: Secondary | ICD-10-CM

## 2014-08-06 MED ORDER — VALACYCLOVIR HCL 1 G PO TABS: ORAL_TABLET | ORAL | Status: DC

## 2014-08-06 MED ORDER — ACYCLOVIR 200 MG PO CAPS: ORAL_CAPSULE | ORAL | Status: DC

## 2014-08-06 NOTE — Progress Notes (Signed)
[facility] [address], [place] [address] [phone]  Date:  08/06/2014   Name:  Linda Mcbride Zagami   DOB:  [birth year]   MRN:  [id number]  PCP:  No PCP Per Patient    Chief Complaint: Vaginal Itching; Vaginal Pain; Sore Throat; Generalized Body Aches; and Fatigue   History of Present Illness:  Linda Mcbride Sandy is a 44 y.o. very pleasant female patient who presents with the following:  She has been sick for about 2 weeks.  She thinks she is having a herpes outbreak.  Sometimes these will get better on their own, but not this time.  She feels like her current sx are all due to her HSV exacerbation She noted a fever about one week ago- now resolved She notes a ST for about one week- also now resolved She has noted vaginal sx for about 3 weeks She was out of valtrex and not able to use it at home.  She uses some homeopathic medication but this time it did not help She may have several outbreaks a year and is not on suppression currently   LMP 7/6  Patient Active Problem List   Diagnosis Date Noted  . Low grade squamous intraepithelial lesion (LGSIL) on cervical Pap smear 09/04/2013    Past Medical History  Diagnosis Date  . Fibromyalgia   . Perforated nasal septum   . Fibromyalgia     Past Surgical History  Procedure Laterality Date  . Septoplasty    . Hernia repair    . Wisdom tooth extraction    . Tonsillectomy      History  Substance Use Topics  . Smoking status: Never Smoker   . Smokeless tobacco: Not on file  . Alcohol Use: No    Family History  Problem Relation Age of Onset  . Cancer Maternal Grandmother     lung, thyroid  . Cancer Maternal Grandfather     lung and thyroid    Allergies  Allergen Reactions  . Clindamycin/Lincomycin Itching  . Sulfa Antibiotics Itching and Nausea And Vomiting  . Penicillins Rash    Medication list has been reviewed and updated.  Current Outpatient Prescriptions on File Prior to Visit   Medication Sig Dispense Refill  . acyclovir (ZOVIRAX) 400 MG tablet Take 1 tablet (400 mg total) by mouth 3 (three) times daily. Take for 5 days as needed for outbreak (Patient not taking: Reported on 02/22/2014) 45 tablet 0  . azithromycin (ZITHROMAX) 250 MG tablet Take 2 tablets today, then 1 every day until finished. (Patient not taking: Reported on 02/22/2014) 6 tablet 0  . chlorpheniramine-HYDROcodone (TUSSIONEX PENNKINETIC ER) 10-8 MG/5ML LQCR Take 5 mLs by mouth every 12 (twelve) hours as needed. (Patient not taking: Reported on 08/06/2014) 60 mL 0  . levofloxacin (LEVAQUIN) 500 MG tablet Take 1 tablet (500 mg total) by mouth daily. (Patient not taking: Reported on 08/06/2014) 10 tablet 0  . pseudoephedrine-guaifenesin (MUCINEX D) 60-600 MG per tablet Take 1 tablet by mouth every 12 (twelve) hours. (Patient not taking: Reported on 08/06/2014) 18 tablet 0  . triamcinolone (NASACORT AQ) 55 MCG/ACT AERO nasal inhaler Place 4 sprays into the nose daily. (Patient not taking: Reported on 08/06/2014) 1 Inhaler 12   No current facility-administered medications on file prior to visit.    Review of Systems:  As per HPI- otherwise negative. BP Readings from Last 3 Encounters:  08/06/14 100/80  02/22/14 108/70  01/02/14 88/58     Physical Examination: Ceasar MonsFiled  Vitals:   08/06/14 0829  BP: 100/80  Pulse: 74  Temp: 98.6 F (37 C)  Resp: 18   Filed Vitals:   08/06/14 0829  Height: 5' (1.524 m)  Weight: 102 lb (46.267 kg)   Body mass index is 19.92 kg/(m^2). Ideal Body Weight: Weight in (lb) to have BMI = 25: 127.7  GEN: WDWN, NAD, Non-toxic, A & O x 3, looks well HEENT: Atraumatic, Normocephalic. Neck supple. No masses, No LAD. Ears and Nose: No external deformity. CV: RRR, No M/G/R. No JVD. No thrill. No extra heart sounds. PULM: CTA B, no wheezes, crackles, rhonchi. No retractions. No resp. distress. No accessory muscle use. ABD: S, NT, ND, +BS. No rebound. No HSM.  Benign belly EXTR:  No c/c/e NEURO Normal gait.  PSYCH: Normally interactive. Conversant. Not depressed or anxious appearing.  Calm demeanor.  There is a healing but still tender HSV lesion on the right labia.  OW genital exam normal, uterus non- tender and adnexa normal  She declines labs today   Assessment and Plan: Genital HSV - Plan: valACYclovir (VALTREX) 1000 MG tablet, acyclovir (ZOVIRAX) 200 MG capsule  HSV recurrence with pain. Treat with valtrex or acyclovir, depending on cost She will let us know if not better soon  Signed Abbe Amsterdam, MD

## 2014-08-06 NOTE — Patient Instructions (Addendum)
Let's get you back on valtrex for your HSV outbreak.  Take it once a day for 5 days for current outbreak. If you like you can then continue to take a 1/2 or whole tablet daily to help prevent outbreaks.   Let me know if you do not feel better in the next couple of days- Sooner if worse.     If the valtrex is too expensive you can use the acyclovir instead- it is cheaper but less convenient dosing

## 2014-10-22 ENCOUNTER — Ambulatory Visit: Payer: Self-pay | Admitting: Family Medicine

## 2015-08-13 ENCOUNTER — Encounter (HOSPITAL_COMMUNITY): Payer: Self-pay | Admitting: *Deleted

## 2016-01-12 ENCOUNTER — Ambulatory Visit (INDEPENDENT_AMBULATORY_CARE_PROVIDER_SITE_OTHER): Payer: BLUE CROSS/BLUE SHIELD

## 2016-01-12 ENCOUNTER — Ambulatory Visit (INDEPENDENT_AMBULATORY_CARE_PROVIDER_SITE_OTHER): Payer: BLUE CROSS/BLUE SHIELD | Admitting: Physician Assistant

## 2016-01-12 VITALS — BP 116/80 | HR 87 | Temp 98.7°F | Resp 17 | Ht 60.0 in | Wt 102.0 lb

## 2016-01-12 DIAGNOSIS — R1084 Generalized abdominal pain: Secondary | ICD-10-CM | POA: Diagnosis not present

## 2016-01-12 DIAGNOSIS — R079 Chest pain, unspecified: Secondary | ICD-10-CM

## 2016-01-12 DIAGNOSIS — R42 Dizziness and giddiness: Secondary | ICD-10-CM

## 2016-01-12 DIAGNOSIS — R3911 Hesitancy of micturition: Secondary | ICD-10-CM | POA: Diagnosis not present

## 2016-01-12 DIAGNOSIS — R9431 Abnormal electrocardiogram [ECG] [EKG]: Secondary | ICD-10-CM

## 2016-01-12 DIAGNOSIS — R059 Cough, unspecified: Secondary | ICD-10-CM

## 2016-01-12 DIAGNOSIS — R05 Cough: Secondary | ICD-10-CM

## 2016-01-12 LAB — POCT CBC
Granulocyte percent: 66.8 % (ref 37–80)
HCT, POC: 38.8 % (ref 37.7–47.9)
Hemoglobin: 14.1 g/dL (ref 12.2–16.2)
Lymph, poc: 0.8 (ref 0.6–3.4)
MCH, POC: 34.3 pg — AB (ref 27–31.2)
MCHC: 36.4 g/dL — AB (ref 31.8–35.4)
MCV: 94.4 fL (ref 80–97)
MID (cbc): 0.8 (ref 0–0.9)
MPV: 7.7 fL (ref 0–99.8)
POC Granulocyte: 3.2 (ref 2–6.9)
POC LYMPH PERCENT: 15.7 % (ref 10–50)
POC MID %: 17.5 % — AB (ref 0–12)
Platelet Count, POC: 262 K/uL (ref 142–424)
RBC: 4.11 M/uL (ref 4.04–5.48)
RDW, POC: 12.7 %
WBC: 4.8 K/uL (ref 4.6–10.2)

## 2016-01-12 LAB — POCT URINALYSIS DIP (MANUAL ENTRY)
Bilirubin, UA: NEGATIVE
Blood, UA: NEGATIVE
Glucose, UA: NEGATIVE
Ketones, POC UA: NEGATIVE
Leukocytes, UA: NEGATIVE
Nitrite, UA: NEGATIVE
Protein Ur, POC: NEGATIVE
Spec Grav, UA: 1.015
Urobilinogen, UA: 0.2
pH, UA: 7.5

## 2016-01-12 LAB — POC MICROSCOPIC URINALYSIS (UMFC): Mucus: ABSENT

## 2016-01-12 MED ORDER — BENZONATATE 100 MG PO CAPS
100.0000 mg | ORAL_CAPSULE | Freq: Three times a day (TID) | ORAL | 0 refills | Status: DC | PRN
Start: 2016-01-12 — End: 2018-01-31

## 2016-01-12 NOTE — Patient Instructions (Addendum)
Start drinking at least 64 oz of fluids daily and eat a BRAT diet (banana, rice, apple, toast). Advance diet as tolerated. Continue taking medications given by previous urgent care. If you do not feel any better in 48 hours return to clinic for futher evaluation. If symptoms worsen before then or you develop any new concerning symptoms please seek care immediately at the ER.    You will be contacted by cardiology for your referral.   Costochondritis Costochondritis is swelling and irritation (inflammation) of the tissue (cartilage) that connects your ribs to your breastbone (sternum). This causes pain in the front of your chest. Usually, the pain:  Starts gradually.  Is in more than one rib. This condition usually goes away on its own over time. Follow these instructions at home:  Do not do anything that makes your pain worse.  If directed, put ice on the painful area:  Put ice in a plastic bag.  Place a towel between your skin and the bag.  Leave the ice on for 20 minutes, 2-3 times a day.  If directed, put heat on the affected area as often as told by your doctor. Use the heat source that your doctor tells you to use, such as a moist heat pack or a heating pad.  Place a towel between your skin and the heat source.  Leave the heat on for 20-30 minutes.  Take off the heat if your skin turns bright red. This is very important if you cannot feel pain, heat, or cold. You may have a greater risk of getting burned.  Take over-the-counter and prescription medicines only as told by your doctor.  Return to your normal activities as told by your doctor. Ask your doctor what activities are safe for you.  Keep all follow-up visits as told by your doctor. This is important. Contact a doctor if:  You have chills or a fever.  Your pain does not go away or it gets worse.  You have a cough that does not go away. Get help right away if:  You are short of breath. This information is not  intended to replace advice given to you by your health care provider. Make sure you discuss any questions you have with your health care provider. Document Released: 06/30/2007 Document Revised: 08/01/2015 Document Reviewed: 05/07/2015 Elsevier Interactive Patient Education  2017 [employer]    Viral Gastroenteritis, Adult Introduction Viral gastroenteritis is also known as the stomach flu. This condition is caused by certain germs (viruses). These germs can be passed from person to person very easily (are very contagious). This condition can cause sudden watery poop (diarrhea), fever, and throwing up (vomiting). Having watery poop and throwing up can make you feel weak and cause you to get dehydrated. Dehydration can make you tired and thirsty, make you have a dry mouth, and make it so you pee (urinate) less often. Older adults and people with other diseases or a weak defense system (immune system) are at higher risk for dehydration. It is important to replace the fluids that you lose from having watery poop and throwing up. Follow these instructions at home: Follow instructions from your doctor about how to care for yourself at home. Eating and drinking Follow these instructions as told by your doctor:  Take an oral rehydration solution (ORS). This is a drink that is sold at pharmacies and stores.  Drink clear fluids in small amounts as you are able, such as:  Water.  Ice chips.  Diluted  fruit juice.  Low-calorie sports drinks.  Eat bland, easy-to-digest foods in small amounts as you are able, such as:  Bananas.  Applesauce.  Rice.  Low-fat (lean) meats.  Toast.  Crackers.  Avoid fluids that have a lot of sugar or caffeine in them.  Avoid alcohol.  Avoid spicy or fatty foods. General instructions  Drink enough fluid to keep your pee (urine) clear or pale yellow.  Wash your hands often. If you cannot use soap and water, use hand sanitizer.  Make sure that all  people in your home wash their hands well and often.  Rest at home while you get better.  Take over-the-counter and prescription medicines only as told by your doctor.  Watch your condition for any changes.  Take a warm bath to help with any burning or pain from having watery poop.  Keep all follow-up visits as told by your doctor. This is important. Contact a doctor if:  You cannot keep fluids down.  Your symptoms get worse.  You have new symptoms.  You feel light-headed or dizzy.  You have muscle cramps. Get help right away if:  You have chest pain.  You feel very weak or you pass out (faint).  You see blood in your throw-up.  Your throw-up looks like coffee grounds.  You have bloody or black poop (stools) or poop that look like tar.  You have a very bad headache, a stiff neck, or both.  You have a rash.  You have very bad pain, cramping, or bloating in your belly (abdomen).  You have trouble breathing.  You are breathing very quickly.  Your heart is beating very quickly.  Your skin feels cold and clammy.  You feel confused.  You have pain when you pee.  You have signs of dehydration, such as:  Dark pee, hardly any pee, or no pee.  Cracked lips.  Dry mouth.  Sunken eyes.  Sleepiness.  Weakness. This information is not intended to replace advice given to you by your health care provider. Make sure you discuss any questions you have with your health care provider. Document Released: 06/30/2007 Document Revised: 08/01/2015 Document Reviewed: 09/17/2014  2017 Elsevier   IF you received an x-ray today, you will receive an invoice from [facility]. Please contact [facility] at [phone] with questions or concerns regarding your invoice.   IF you received labwork today, you will receive an invoice from [facility]. Please contact [facility] at [phone] with questions or concerns regarding your invoice.   Our billing staff will  not be able to assist you with questions regarding bills from these companies.  You will be contacted with the lab results as soon as they are available. The fastest way to get your results is to activate your My Chart account. Instructions are located on the last page of this paperwork. If you have not heard from us regarding the results in 2 weeks, please contact this office.

## 2016-01-12 NOTE — Progress Notes (Signed)
MRN: [id number] DOB: 03-27-1970  Subjective:   Linda Mcbride is a 45 y.o. female presenting for chief complaint of Shortness of Breath (Since Friday night. Pt states current chest pain that has moved down and she "feels like its a UTI now") .  Reports one week history of lung pain after perfume exposure. Had associated yellow mucous production, lower abdomen pain, sinus congestion, productive cough (no hemoptysis), shortness of breath, myalgia, night sweats, chills, nausea, heartburn, abdominal pain, diarrhea and constipation. Pain is relieved with sitting in one position. It is made worse with moving around. She went to an urgent care 3 days ago and was diagnosed with pleurisy and given naproxen and flexeril. Denies any acute injury.  Has tried sleep, tea, flexeril, and naproxen with moderate relief. Denies fever, ear pain, sore throat and wheezing, vomiting. Has had sick contact with husband. No history of seasonal allergies, no history of asthma. Patient has not had flu shot this season. No smoking or  alcohol. No recent travel or immobilization. Has FH of heart disease, mom maybe had an MI in 2s or 69s she cannot remember. Denies any other aggravating or relieving factors, no other questions or concerns. Drinks 26 oz of water a day.  Review of Systems  Eyes: Negative for double vision and pain.  Cardiovascular: Positive for palpitations. Negative for leg swelling.  Genitourinary: Positive for frequency and urgency. Negative for dysuria, flank pain and hematuria.       Positive for hesitancy.    Skin: Negative for rash.  Neurological: Positive for dizziness.   Linda Mcbride has a current medication list which includes the following prescription(s): acyclovir, cyclobenzaprine, hydrocodone-ibuprofen, naproxen, zolpidem, benzonatate, and junel 1/20. Also is allergic to clindamycin/lincomycin; doxycycline; sulfa antibiotics; and penicillins.  Linda Mcbride  has a past medical history of Fibromyalgia;  Fibromyalgia; and Perforated nasal septum. Also  has a past surgical history that includes Septoplasty; Hernia repair; Wisdom tooth extraction; and Tonsillectomy.   Objective:   Vitals: BP 116/80 (BP Location: Left Arm, Patient Position: Sitting, Cuff Size: Normal)   Pulse 87   Temp 98.7 F (37.1 C) (Oral)   Resp 17   Ht 5' (1.524 m)   Wt 102 lb (46.3 kg)   SpO2 97%   BMI 19.92 kg/m   Physical Exam  Constitutional: She is oriented to person, place, and time. She appears well-developed and well-nourished.  HENT:  Head: Normocephalic and atraumatic.  Eyes: Conjunctivae and EOM are normal. Pupils are equal, round, and reactive to light.  Neck: Normal range of motion.  Cardiovascular: Normal rate, regular rhythm, normal heart sounds and intact distal pulses.   Pulmonary/Chest: Effort normal. She has wheezes ( one wheeze ausculatated on left posterior lung field). She exhibits tenderness (with palpation of left anterior and posterior chest wall).  Abdominal: Soft. Normal appearance and bowel sounds are normal. There is generalized tenderness. There is no CVA tenderness.  Lymphadenopathy:       Head (right side): No submental, no submandibular, no tonsillar, no preauricular, no posterior auricular and no occipital adenopathy present.       Head (left side): No submental, no submandibular, no tonsillar, no preauricular, no posterior auricular and no occipital adenopathy present.    She has no cervical adenopathy.       Right: No supraclavicular adenopathy present.       Left: No supraclavicular adenopathy present.  Neurological: She is alert and oriented to person, place, and time. No cranial nerve deficit or sensory deficit. She displays  a negative Romberg sign. Coordination normal.  Reflex Scores:      Patellar reflexes are 2+ on the right side and 2+ on the left side. Skin: Skin is warm and dry.  Diffuse horizontal well healed scars noted on anterior aspect of left forearm.      Results for orders placed or performed in visit on 01/12/16 (from the past 24 hour(s))  POCT urinalysis dipstick     Status: None   Collection Time: 01/12/16  2:40 PM  Result Value Ref Range   Color, UA yellow yellow   Clarity, UA clear clear   Glucose, UA negative negative   Bilirubin, UA negative negative   Ketones, POC UA negative negative   Spec Grav, UA 1.015    Blood, UA negative negative   pH, UA 7.5    Protein Ur, POC negative negative   Urobilinogen, UA 0.2    Nitrite, UA Negative Negative   Leukocytes, UA Negative Negative  POCT Microscopic Urinalysis ([facility])     Status: Abnormal   Collection Time: 01/12/16  2:41 PM  Result Value Ref Range   WBC,UR,HPF,POC None None WBC/hpf   RBC,UR,HPF,POC None None RBC/hpf   Bacteria None None, Too numerous to count   Mucus Absent Absent   Epithelial Cells, UR Per Microscopy Few (A) None, Too numerous to count cells/hpf  POCT CBC     Status: Abnormal   Collection Time: 01/12/16  2:57 PM  Result Value Ref Range   WBC 4.8 4.6 - 10.2 K/uL   Lymph, poc 0.8 0.6 - 3.4   POC LYMPH PERCENT 15.7 10 - 50 %L   MID (cbc) 0.8 0 - 0.9   POC MID % 17.5 (A) 0 - 12 %M   POC Granulocyte 3.2 2 - 6.9   Granulocyte percent 66.8 37 - 80 %G   RBC 4.11 4.04 - 5.48 M/uL   Hemoglobin 14.1 12.2 - 16.2 g/dL   HCT, POC 38.8 37.7 - 47.9 %   MCV 94.4 80 - 97 fL   MCH, POC 34.3 (A) 27 - 31.2 pg   MCHC 36.4 (A) 31.8 - 35.4 g/dL   RDW, POC 12.7 %   Platelet Count, POC 262 142 - 424 K/uL   MPV 7.7 0 - 99.8 fL    Dg Chest 2 View  Result Date: 01/12/2016 CLINICAL DATA:  Shortness of breath. EXAM: CHEST  2 VIEW COMPARISON:  No prior. FINDINGS: Mediastinum and hilar structures normal. Lungs are clear. No pleural effusion or pneumothorax. Degenerative changes thoracic spine . IMPRESSION: No acute cardiopulmonary disease. Electronically Signed   By: Marcello Moores  Register   On: 01/12/2016 14:17   Orthostatic VS for the past 24 hrs (Last 3 readings):  BP- Lying  Pulse- Lying BP- Sitting Pulse- Sitting BP- Standing at 0 minutes Pulse- Standing at 0 minutes  01/12/16 1425 121/81 74 126/83 81 122/84 81   EKG shows sinus rhythm at 80 bpm with short PR interval of 138mec. No acute ST or T wave changes. No prior EKG for comparison. EKG findings presented to Dr. GCarlota Raspberry   Assessment and Plan :  This case was precepted with Dr. GCarlota Raspberry   1. Chest pain, unspecified type -Likely costochondritis, instructed to continue medication prescribed at urgent care 3 days ago (flexeril and naproxen). Given educational material on costochondritis.  - EKG 12-Lead  2. Cough - POCT CBC - DG Chest 2 View; Future - benzonatate (TESSALON) 100 MG capsule; Take 1-2 capsules (100-200 mg total) by mouth 3 (  three) times daily as needed for cough.  Dispense: 40 capsule; Refill: 0  3. Urinary hesitancy Labs pending  - POCT urinalysis dipstick - POCT Microscopic Urinalysis ([facility]) - CMP14+EGFR  4. Dizziness Labs pending, instruc - Orthostatic vital signs - TSH  5. Nonspecific abnormal electrocardiogram (ECG) (EKG) -Due to findings of short PR interval on EKG and no prior EKG for comparison, will refer to cardiology for further workup.  - Ambulatory referral to Cardiology  6. Generalized abdominal pain Labs pending, instructed to continue to drink water, increase to at least 64 oz daily and BRAT diet, advance diet as tolerated.  - CMP14+EGFR  -Return to clinic if symptoms worsen, do not improve in 48 hours, or as needed  Tenna Delaine, PA-C  [facility] 01/12/2016 3:05 PM

## 2016-01-13 LAB — CMP14+EGFR
ALBUMIN: 3.9 g/dL (ref 3.5–5.5)
ALT: 11 IU/L (ref 0–32)
AST: 18 IU/L (ref 0–40)
Albumin/Globulin Ratio: 1.4 (ref 1.2–2.2)
Alkaline Phosphatase: 46 IU/L (ref 39–117)
BILIRUBIN TOTAL: 0.2 mg/dL (ref 0.0–1.2)
BUN / CREAT RATIO: 19 (ref 9–23)
BUN: 12 mg/dL (ref 6–24)
CALCIUM: 9.3 mg/dL (ref 8.7–10.2)
CHLORIDE: 101 mmol/L (ref 96–106)
CO2: 26 mmol/L (ref 18–29)
CREATININE: 0.62 mg/dL (ref 0.57–1.00)
GFR, EST AFRICAN AMERICAN: 124 mL/min/{1.73_m2} (ref >59)
GFR, EST NON AFRICAN AMERICAN: 108 mL/min/{1.73_m2} (ref >59)
GLUCOSE: 80 mg/dL (ref 65–99)
Globulin, Total: 2.8 g/dL (ref 1.5–4.5)
Potassium: 4.4 mmol/L (ref 3.5–5.2)
Sodium: 139 mmol/L (ref 134–144)
TOTAL PROTEIN: 6.7 g/dL (ref 6.0–8.5)

## 2016-01-13 LAB — TSH: TSH: 1.73 u[IU]/mL (ref 0.450–4.500)

## 2016-01-21 ENCOUNTER — Encounter: Payer: Self-pay | Admitting: *Deleted

## 2017-02-07 DIAGNOSIS — Z01419 Encounter for gynecological examination (general) (routine) without abnormal findings: Secondary | ICD-10-CM | POA: Diagnosis not present

## 2017-02-07 DIAGNOSIS — Z6822 Body mass index (BMI) 22.0-22.9, adult: Secondary | ICD-10-CM | POA: Diagnosis not present

## 2017-03-14 DIAGNOSIS — J019 Acute sinusitis, unspecified: Secondary | ICD-10-CM | POA: Diagnosis not present

## 2017-03-14 DIAGNOSIS — J209 Acute bronchitis, unspecified: Secondary | ICD-10-CM | POA: Diagnosis not present

## 2017-03-21 DIAGNOSIS — Z0000 Encounter for general adult medical examination without abnormal findings: Secondary | ICD-10-CM | POA: Diagnosis not present

## 2017-03-21 DIAGNOSIS — R5383 Other fatigue: Secondary | ICD-10-CM | POA: Diagnosis not present

## 2017-03-21 DIAGNOSIS — Z79899 Other long term (current) drug therapy: Secondary | ICD-10-CM | POA: Diagnosis not present

## 2017-05-31 ENCOUNTER — Emergency Department (HOSPITAL_COMMUNITY): Payer: Self-pay

## 2017-05-31 ENCOUNTER — Ambulatory Visit: Payer: BLUE CROSS/BLUE SHIELD | Admitting: Family Medicine

## 2017-05-31 ENCOUNTER — Encounter (HOSPITAL_COMMUNITY): Payer: Self-pay | Admitting: Emergency Medicine

## 2017-05-31 ENCOUNTER — Ambulatory Visit: Payer: Self-pay | Admitting: *Deleted

## 2017-05-31 ENCOUNTER — Emergency Department (HOSPITAL_COMMUNITY)
Admission: EM | Admit: 2017-05-31 | Discharge: 2017-05-31 | Discharge status: Against Medical Advice | Payer: Self-pay | Attending: Emergency Medicine | Admitting: Emergency Medicine

## 2017-05-31 DIAGNOSIS — R079 Chest pain, unspecified: Secondary | ICD-10-CM | POA: Diagnosis not present

## 2017-05-31 DIAGNOSIS — Z5321 Procedure and treatment not carried out due to patient leaving prior to being seen by health care provider: Secondary | ICD-10-CM | POA: Insufficient documentation

## 2017-05-31 LAB — BASIC METABOLIC PANEL
Anion gap: 5 (ref 5–15)
BUN: 8 mg/dL (ref 6–20)
CALCIUM: 8.8 mg/dL — AB (ref 8.9–10.3)
CO2: 27 mmol/L (ref 22–32)
Chloride: 106 mmol/L (ref 101–111)
Creatinine, Ser: 0.51 mg/dL (ref 0.44–1.00)
GFR calc Af Amer: >60 mL/min (ref >60)
Glucose, Bld: 118 mg/dL — ABNORMAL HIGH (ref 65–99)
POTASSIUM: 3.8 mmol/L (ref 3.5–5.1)
Sodium: 138 mmol/L (ref 135–145)

## 2017-05-31 LAB — CBC
HEMATOCRIT: 41.4 % (ref 36.0–46.0)
HEMOGLOBIN: 13.7 g/dL (ref 12.0–15.0)
MCH: 31.6 pg (ref 26.0–34.0)
MCHC: 33.1 g/dL (ref 30.0–36.0)
MCV: 95.6 fL (ref 78.0–100.0)
Platelets: 313 10*3/uL (ref 150–400)
RBC: 4.33 MIL/uL (ref 3.87–5.11)
RDW: 12.8 % (ref 11.5–15.5)
WBC: 5.2 10*3/uL (ref 4.0–10.5)

## 2017-05-31 LAB — I-STAT BETA HCG BLOOD, ED (MC, WL, AP ONLY): I-stat hCG, quantitative: <5 m[IU]/mL (ref <5)

## 2017-05-31 LAB — I-STAT TROPONIN, ED: TROPONIN I, POC: 0 ng/mL (ref 0.00–0.08)

## 2017-05-31 NOTE — ED Triage Notes (Signed)
Pt c/o lefts sided chest pains that have been going on for couple years but yesterday got worse and trouble breathing. Having weakness as well and states she couldn't walk yesterday.

## 2017-05-31 NOTE — ED Notes (Signed)
Call for room assignment, no answer. [employer]

## 2017-05-31 NOTE — ED Notes (Signed)
Called for revital signs, no answer

## 2017-05-31 NOTE — Telephone Encounter (Signed)
Yesterday patient was feeling poorly- pain in chest and difficulty breathing. Pulse was steady -72. Patient perception was that it was racing.  Husband is giving information-  He is asking patient questions- triage states patient needs to be seen at ED- husband has to convince patient to go- will cancel appointment at office. They are going to [facility] ED.  Reason for Disposition . Slow, shallow and weak breathing  Answer Assessment - Initial Assessment Questions 1. RESPIRATORY STATUS: "Describe your breathing?" (e.g., wheezing, shortness of breath, unable to speak, severe coughing)      Sitting up and moving around causes SOB 2. ONSET: "When did this breathing problem begin?"      yesterday 3. PATTERN "Does the difficult breathing come and go, or has it been constant since it started?"      Comes and goes 4. SEVERITY: "How bad is your breathing?" (e.g., mild, moderate, severe)    - MILD: No SOB at rest, mild SOB with walking, speaks normally in sentences, can lay down, no retractions, pulse < 100.    - MODERATE: SOB at rest, SOB with minimal exertion and prefers to sit, cannot lie down flat, speaks in phrases, mild retractions, audible wheezing, pulse 100-120.    - SEVERE: Very SOB at rest, speaks in single words, struggling to breathe, sitting hunched forward, retractions, pulse > 120      Mild- P 70 5. RECURRENT SYMPTOM: "Have you had difficulty breathing before?" If so, ask: "When was the last time?" and "What happened that time?"      Yes- a week ago- got better 6. CARDIAC HISTORY: "Do you have any history of heart disease?" (e.g., heart attack, angina, bypass surgery, angioplasty)      no 7. LUNG HISTORY: "Do you have any history of lung disease?"  (e.g., pulmonary embolus, asthma, emphysema)     Bronchitis- 2 months ago 8. CAUSE: "What do you think is causing the breathing problem?"      unknown 9. OTHER SYMPTOMS: "Do you have any other symptoms? (e.g., dizziness, runny nose, cough, chest  pain, fever)     Dizziness, chest pain 10. PREGNANCY: "Is there any chance you are pregnant?" "When was your last menstrual period?"       No- LMP- 1 week ago 11. TRAVEL: "Have you traveled out of the country in the last month?" (e.g., travel history, exposures)       no  Protocols used: BREATHING DIFFICULTY-A-AH

## 2017-06-07 DIAGNOSIS — E785 Hyperlipidemia, unspecified: Secondary | ICD-10-CM | POA: Diagnosis not present

## 2017-06-07 DIAGNOSIS — R0602 Shortness of breath: Secondary | ICD-10-CM | POA: Diagnosis not present

## 2017-06-07 DIAGNOSIS — R079 Chest pain, unspecified: Secondary | ICD-10-CM | POA: Diagnosis not present

## 2017-06-07 DIAGNOSIS — R0789 Other chest pain: Secondary | ICD-10-CM | POA: Diagnosis not present

## 2017-06-07 DIAGNOSIS — E039 Hypothyroidism, unspecified: Secondary | ICD-10-CM | POA: Diagnosis not present

## 2017-08-01 DIAGNOSIS — Z79899 Other long term (current) drug therapy: Secondary | ICD-10-CM | POA: Diagnosis not present

## 2017-08-01 DIAGNOSIS — E7800 Pure hypercholesterolemia, unspecified: Secondary | ICD-10-CM | POA: Diagnosis not present

## 2017-08-01 DIAGNOSIS — M797 Fibromyalgia: Secondary | ICD-10-CM | POA: Diagnosis not present

## 2017-08-01 DIAGNOSIS — G4700 Insomnia, unspecified: Secondary | ICD-10-CM | POA: Diagnosis not present

## 2017-08-06 DIAGNOSIS — E7800 Pure hypercholesterolemia, unspecified: Secondary | ICD-10-CM | POA: Diagnosis not present

## 2017-08-06 DIAGNOSIS — I951 Orthostatic hypotension: Secondary | ICD-10-CM | POA: Diagnosis not present

## 2017-08-06 DIAGNOSIS — G5691 Unspecified mononeuropathy of right upper limb: Secondary | ICD-10-CM | POA: Diagnosis not present

## 2017-08-06 DIAGNOSIS — G4700 Insomnia, unspecified: Secondary | ICD-10-CM | POA: Diagnosis not present

## 2017-08-17 DIAGNOSIS — R87612 Low grade squamous intraepithelial lesion on cytologic smear of cervix (LGSIL): Secondary | ICD-10-CM | POA: Diagnosis not present

## 2017-10-11 ENCOUNTER — Ambulatory Visit: Payer: Self-pay

## 2017-10-11 DIAGNOSIS — R110 Nausea: Secondary | ICD-10-CM | POA: Diagnosis not present

## 2017-10-11 DIAGNOSIS — R1084 Generalized abdominal pain: Secondary | ICD-10-CM | POA: Diagnosis not present

## 2017-10-11 NOTE — Telephone Encounter (Signed)
Incoming call from patient and husband with complaint of abdominal pain with nausea and difficult breathing.  States that it hurts in the center of the stomach.  Radiating to the left side.  Started about a week ago.  State it was gradual onset.  Rates the pain as severe.  Only had this type of pain with pneumonia.  Patient does report pain  Under the rib cage.  Patient states that pain worsened over the weekend.  Patient reports nausea, LMP End of August.  Provided care  Advice.  Per protocol recommend that patient go to ED for further evaluation.  Patient and husband voiced understanding.      Reason for Disposition . [1] SEVERE pain (e.g., excruciating) AND [2] present > 1 hour  Answer Assessment - Initial Assessment Questions 1. LOCATION: "Where does it hurt?"      *No Answer*center moved  To left side. Makes it hard to breathe.   2. RADIATION: "Does the pain shoot anywhere else?" (e.g., chest, back)    movining to left side 3. ONSET: "When did the pain begin?" (e.g., minutes, hours or days ago)      A week ago 4. SUDDEN: "Gradual or sudden onset?"     graduL WORSE OVER THE WEEKEND5. PATTERN "Does the pain come and go, or is it constant?"    - If constant: "Is it getting better, staying the same, or worsening?"      (Note: Constant means the pain never goes away completely; most serious pain is constant and it progresses)     - If intermittent: "How long does it last?" "Do you have pain now?"     (Note: Intermittent means the pain goes away completely between bouts)     Constant  Intensity varies 6. SEVERITY: "How bad is the pain?"  (e.g., Scale 1-10; mild, moderate, or severe)    - MILD (1-3): doesn't interfere with normal activities, abdomen soft and not tender to touch     - MODERATE (4-7): interferes with normal activities or awakens from sleep, tender to touch     - SEVERE (8-10): excruciating pain, doubled over, unable to do any normal activities       severe 7. RECURRENT SYMPTOM:  "Have you ever had this type of abdominal pain before?" If so, ask: "When was the last time?" and "What happened that time?"      Only when I had pnemonia 8. AGGRAVATING FACTORS: "Does anything seem to cause this pain?" (e.g., foods, stress, alcohol)     Sitting , talking 9. CARDIAC SYMPTOMS: "Do you have any of the following symptoms: chest pain, difficulty breathing, sweating, nausea?"     Difficulty breathing.  Feels pressure near rib cage 10. OTHER SYMPTOMS: "Do you have any other symptoms?" (e.g., fever, vomiting, diarrhea)       nausea 11. PREGNANCY: "Is there any chance you are pregnant?" "When was your last menstrual period?"      End of August  Protocols used: ABDOMINAL PAIN - UPPER-A-AH

## 2017-10-12 DIAGNOSIS — R1013 Epigastric pain: Secondary | ICD-10-CM | POA: Diagnosis not present

## 2017-10-12 DIAGNOSIS — M797 Fibromyalgia: Secondary | ICD-10-CM | POA: Diagnosis not present

## 2017-10-12 DIAGNOSIS — R0602 Shortness of breath: Secondary | ICD-10-CM | POA: Diagnosis not present

## 2017-10-13 DIAGNOSIS — R1013 Epigastric pain: Secondary | ICD-10-CM | POA: Diagnosis not present

## 2018-01-11 DIAGNOSIS — G4700 Insomnia, unspecified: Secondary | ICD-10-CM | POA: Diagnosis not present

## 2018-01-11 DIAGNOSIS — E7800 Pure hypercholesterolemia, unspecified: Secondary | ICD-10-CM | POA: Diagnosis not present

## 2018-01-11 DIAGNOSIS — M797 Fibromyalgia: Secondary | ICD-10-CM | POA: Diagnosis not present

## 2018-01-11 DIAGNOSIS — R0602 Shortness of breath: Secondary | ICD-10-CM | POA: Diagnosis not present

## 2018-01-31 ENCOUNTER — Encounter: Payer: Self-pay | Admitting: Emergency Medicine

## 2018-01-31 ENCOUNTER — Ambulatory Visit (INDEPENDENT_AMBULATORY_CARE_PROVIDER_SITE_OTHER): Payer: BLUE CROSS/BLUE SHIELD | Admitting: Emergency Medicine

## 2018-01-31 VITALS — HR 99 | Ht 59.0 in | Wt 104.0 lb

## 2018-01-31 DIAGNOSIS — R0600 Dyspnea, unspecified: Secondary | ICD-10-CM

## 2018-01-31 DIAGNOSIS — R0602 Shortness of breath: Secondary | ICD-10-CM | POA: Diagnosis not present

## 2018-01-31 DIAGNOSIS — R053 Chronic cough: Secondary | ICD-10-CM | POA: Insufficient documentation

## 2018-01-31 DIAGNOSIS — R05 Cough: Secondary | ICD-10-CM | POA: Diagnosis not present

## 2018-01-31 NOTE — Patient Instructions (Signed)
We will perform full pulmonary function testing to better evaluate your airflow in your upper and lower airways. Follow with Dr Delton Coombes next available after your PFT to review.

## 2018-01-31 NOTE — Assessment & Plan Note (Signed)
Longstanding cough with clearly identified triggers that includes fumes, perfume, smells, chemicals, smoke.  The overall picture sounds like both upper airway irritation and some bronchospasm based on her chest tightness, wheezing, dyspnea.  Difficult to tease out her symptoms as some of her complaints are somatic and sound functional-for example she indicates that at times when she has paroxysmal cough she develops paralysis, hemiplegia.  Etiology of this is unclear to me.  I do think she needs to be evaluated for true airway obstruction with full pulmonary function testing.  Likely will need to be empirically treated going forward for rhinitis, GERD.  She has had allergy testing in remote past but cannot remember the results, was never on immunotherapy.

## 2018-01-31 NOTE — Progress Notes (Signed)
Subjective:    Patient ID: Linda Mcbride, female    DOB: 03-26-1970, 48 y.o.   MRN: [id number]  HPI 48 year old woman, never smoker with a history of fibromyalgia, presents today for new evaluation for shortness of breath and cough.  This has been longstanding - she has had cough, worst when she is outside, exposed to dry air, fumes, perfume, smoke. Causes some sinus mucous, sometimes coughs up phlegm, clear. Has been given albuterol before, unclear whether it has been helpful. Has had allergy testing, never allergy shots. 48 year old woman, able to exert at home, not out of the home.   She coughs every day. Sometimes at night - can wake her. No reflux sx. Seems to be sensitive to mold, dust, carpets.   Some of her sx sound functional, for example with some episodes she now only has dyspnea and cough but experiences transient hemiplegia.  Chest x-ray 05/31/2017 reviewed by me, shows normal heart shadow, normal lung parenchyma, no abnormalities   Review of Systems  Constitutional: Negative for fever and unexpected weight change.  HENT: Positive for congestion, dental problem, sneezing and sore throat. Negative for ear pain, nosebleeds, postnasal drip, rhinorrhea, sinus pressure and trouble swallowing.   Eyes: Negative for redness and itching.  Respiratory: Positive for cough, chest tightness and shortness of breath. Negative for wheezing.   Cardiovascular: Negative for palpitations and leg swelling.  Gastrointestinal: Positive for abdominal pain. Negative for nausea and vomiting.  Genitourinary: Negative for dysuria.  Musculoskeletal: Negative for joint swelling.  Skin: Negative for rash.  Allergic/Immunologic: Negative.  Negative for environmental allergies, food allergies and immunocompromised state.  Neurological: Negative for headaches.  Hematological: Does not bruise/bleed easily.  Psychiatric/Behavioral: Negative for dysphoric mood. The patient is not nervous/anxious.    Past Medical History:    Diagnosis Date  . Fibromyalgia   . Fibromyalgia   . Perforated nasal septum      Family History  Problem Relation Age of Onset  . Cancer Maternal Grandmother        lung, thyroid  . Cancer Maternal Grandfather        lung and thyroid     Social History   Socioeconomic History  . Marital status: Single    Spouse name: Not on file  . Number of children: Not on file  . Years of education: Not on file  . Highest education level: Not on file  Occupational History  . Not on file  Social Needs  . Financial resource strain: Not on file  . Food insecurity:    Worry: Not on file    Inability: Not on file  . Transportation needs:    Medical: Not on file    Non-medical: Not on file  Tobacco Use  . Smoking status: Never Smoker  . Smokeless tobacco: Never Used  Substance and Sexual Activity  . Alcohol use: No  . Drug use: No  . Sexual activity: Not Currently    Birth control/protection: None  Lifestyle  . Physical activity:    Days per week: Not on file    Minutes per session: Not on file  . Stress: Not on file  Relationships  . Social connections:    Talks on phone: Not on file    Gets together: Not on file    Attends religious service: Not on file    Active member of club or organization: Not on file    Attends meetings of clubs or organizations: Not on file    Relationship status:  Not on file  . Intimate partner violence:    Fear of current or ex partner: Not on file    Emotionally abused: Not on file    Physically abused: Not on file    Forced sexual activity: Not on file  Other Topics Concern  . Not on file  Social History Narrative  . Not on file  Engineer, agriculturaliano teacher,  From [place], [place] in [place] Own[place] cats, no other pets.   Allergies  Allergen Reactions  . Ciprofloxacin Other (See Comments)    Tendon pain   . Clindamycin/Lincomycin Itching  . Doxycycline Itching  . Sulfa Antibiotics Itching and Nausea And Vomiting  . Penicillins Rash     Outpatient  Medications Prior to Visit  Medication Sig Dispense Refill  . hydrocodone-ibuprofen (VICOPROFEN) 5-200 MG tablet Take 1 tablet by mouth every 8 (eight) hours as needed for pain.    Colleen Can. JUNEL 1/20 1-20 MG-MCG tablet Take 1 tablet by mouth daily. as directed  0  . zolpidem (AMBIEN) 10 MG tablet 5 mg.    . zolpidem (AMBIEN) 10 MG tablet Take 10 mg by mouth at bedtime as needed for sleep.    Marland Kitchen. acyclovir (ZOVIRAX) 200 MG capsule Take 2 pills three times a day for 5 days as needed for outbreak, and take 2 pills twice a day as needed for suppression 120 capsule 3  . benzonatate (TESSALON) 100 MG capsule Take 1-2 capsules (100-200 mg total) by mouth 3 (three) times daily as needed for cough. 40 capsule 0  . cyclobenzaprine (FLEXERIL) 10 MG tablet Take 10 mg by mouth 3 (three) times daily as needed for muscle spasms.    . naproxen (NAPROSYN) 500 MG tablet Take 500 mg by mouth 2 (two) times daily with a meal.     No facility-administered medications prior to visit.         Objective:   Physical Exam Vitals:   01/31/18 1041  Pulse: 99  SpO2: 95%  Weight: 104 lb (47.2 kg)  Height: 4\' 11"  (1.499 m)   Gen: Pleasant, thin woman, in no distress,  normal affect  ENT: No lesions,  mouth clear,  oropharynx clear, no postnasal drip  Neck: No JVD, no stridor  Lungs: No use of accessory muscles, no crackles or wheezing on normal respiration, no wheeze on forced expiration  Cardiovascular: RRR, heart sounds normal, no murmur or gallops, no peripheral edema  Musculoskeletal: No deformities, no cyanosis or clubbing  Neuro: alert, awake, non focal  Skin: Warm, no lesions or rash       Assessment & Plan:  Chronic cough Longstanding cough with clearly identified triggers that includes fumes, perfume, smells, chemicals, smoke.  The overall picture sounds like both upper airway irritation and some bronchospasm based on her chest tightness, wheezing, dyspnea.  Difficult to tease out her symptoms as some  of her complaints are somatic and sound functional-for example she indicates that at times when she has paroxysmal cough she develops paralysis, hemiplegia.  Etiology of this is unclear to me.  I do think she needs to be evaluated for true airway obstruction with full pulmonary function testing.  Likely will need to be empirically treated going forward for rhinitis, GERD.  She has had allergy testing in remote past but cannot remember the results, was never on immunotherapy.  Dyspnea Work-up as above.  Her dyspnea goes hand-in-hand with her recurrent cough, either upper airway obstruction, bronchospasm or both.  We will expand the work-up, consider imaging,  cardiopulmonary exercise testing depending on PFT results  Levy Pupaobert Jermall Isaacson, MD, PhD 01/31/2018, 12:45 PM [facility] [phone] or if no answer [phone]

## 2018-01-31 NOTE — Assessment & Plan Note (Addendum)
Work-up as above.  Her dyspnea goes hand-in-hand with her recurrent cough, either upper airway obstruction, bronchospasm or both.  We will expand the work-up, consider imaging, cardiopulmonary exercise testing depending on PFT results

## 2018-02-07 ENCOUNTER — Ambulatory Visit: Payer: BLUE CROSS/BLUE SHIELD | Admitting: *Deleted

## 2018-02-07 ENCOUNTER — Ambulatory Visit: Payer: BLUE CROSS/BLUE SHIELD | Admitting: Emergency Medicine

## 2018-02-07 ENCOUNTER — Encounter: Payer: Self-pay | Admitting: Emergency Medicine

## 2018-02-07 DIAGNOSIS — R05 Cough: Secondary | ICD-10-CM | POA: Diagnosis not present

## 2018-02-07 DIAGNOSIS — R053 Chronic cough: Secondary | ICD-10-CM

## 2018-02-07 DIAGNOSIS — R0602 Shortness of breath: Secondary | ICD-10-CM | POA: Diagnosis not present

## 2018-02-07 DIAGNOSIS — R0600 Dyspnea, unspecified: Secondary | ICD-10-CM

## 2018-02-07 LAB — PULMONARY FUNCTION TEST
DL/VA % pred: 124 %
DL/VA: 5.09 ml/min/mmHg/L
DLCO unc % pred: 112 %
DLCO unc: 19.65 ml/min/mmHg
FEF 25-75 Post: 3.05 L/s
FEF 25-75 Pre: 3.13 L/s
FEF2575-%Change-Post: -2 %
FEF2575-%Pred-Post: 129 %
FEF2575-%Pred-Pre: 133 %
FEV1-%Change-Post: 2 %
FEV1-%Pred-Post: 123 %
FEV1-%Pred-Pre: 119 %
FEV1-Post: 2.7 L
FEV1-Pre: 2.62 L
FEV1FVC-%Change-Post: 1 %
FEV1FVC-%Pred-Pre: 105 %
FEV6-%Change-Post: 1 %
FEV6-Post: 3.07 L
FEV6-Pre: 3.02 L
FEV6FVC-%Change-Post: 0 %
FVC-%Change-Post: 1 %
FVC-%Pred-Post: 115 %
FVC-%Pred-Pre: 113 %
FVC-Post: 3.07 L
FVC-Pre: 3.03 L
Post FEV1/FVC ratio: 88 %
Post FEV6/FVC ratio: 100 %
Pre FEV1/FVC ratio: 86 %
Pre FEV6/FVC Ratio: 100 %
RV % pred: 87 %
RV: 1.33 L
TLC % pred: 102 %
TLC: 4.43 L

## 2018-02-07 NOTE — Assessment & Plan Note (Signed)
She notes sensitivity to environmental exposures but also tells me that she does not have chronic rhinitis, has not responded in the past antihistamines or allergy medication.  To my knowledge she has not had skin testing.  There may be components of both allergies and GERD here but she denies these.  She is more concerned that there is environmental sensitivity, injury being caused by the fumes she inhales.  We did talk about using a mask, practicing avoidance.  It sounds like she will be willing to do this.  I do not think she be willing to go see an allergist.

## 2018-02-07 NOTE — Progress Notes (Signed)
Subjective:    Patient ID: Linda Mcbride, female    DOB: 08/14/1970, 48 y.o.   MRN: [id number]  HPI 48 year old woman, never smoker with a history of fibromyalgia, presents today for new evaluation for shortness of breath and cough.  This has been longstanding - she has had cough, worst when she is outside, exposed to dry air, fumes, perfume, smoke. Causes some sinus mucous, sometimes coughs up phlegm, clear. Has been given albuterol before, unclear whether it has been helpful. Has had allergy testing, never allergy shots. She is able to exert at home, not out of the home.   She coughs every day. Sometimes at night - can wake her. No reflux sx. Seems to be sensitive to mold, dust, carpets.   Some of her sx sound functional, for example with some episodes she now only has dyspnea and cough but experiences transient hemiplegia.  Chest x-ray 05/31/2017 reviewed by me, shows normal heart shadow, normal lung parenchyma, no abnormalities  ROV 02/07/18 --this is a follow-up visit for 48 year old never smoker to evaluate cough with associated dyspnea.  There seems to be a connection to particular triggers.  We performed pulmonary function testing today that I have reviewed.  This shows normal airflows with a normal flow volume loop, normal lung volumes and a normal diffusion capacity.  She describes malaise, cough, body aches after our visit last time - got better after a few days. We discussed today starting allergy regimen - she isn't interested in doing this, says she has done so without effect.    Review of Systems  Constitutional: Negative for fever and unexpected weight change.  HENT: Positive for congestion, dental problem, sneezing and sore throat. Negative for ear pain, nosebleeds, postnasal drip, rhinorrhea, sinus pressure and trouble swallowing.   Eyes: Negative for redness and itching.  Respiratory: Positive for cough, chest tightness and shortness of breath. Negative for wheezing.     Cardiovascular: Negative for palpitations and leg swelling.  Gastrointestinal: Positive for abdominal pain. Negative for nausea and vomiting.  Genitourinary: Negative for dysuria.  Musculoskeletal: Negative for joint swelling.  Skin: Negative for rash.  Allergic/Immunologic: Negative.  Negative for environmental allergies, food allergies and immunocompromised state.  Neurological: Negative for headaches.  Hematological: Does not bruise/bleed easily.  Psychiatric/Behavioral: Negative for dysphoric mood. The patient is not nervous/anxious.    Past Medical History:  Diagnosis Date  . Fibromyalgia   . Fibromyalgia   . Perforated nasal septum      Family History  Problem Relation Age of Onset  . Cancer Maternal Grandmother        lung, thyroid  . Cancer Maternal Grandfather        lung and thyroid     Social History   Socioeconomic History  . Marital status: Single    Spouse name: Not on file  . Number of children: Not on file  . Years of education: Not on file  . Highest education level: Not on file  Occupational History  . Not on file  Social Needs  . Financial resource strain: Not on file  . Food insecurity:    Worry: Not on file    Inability: Not on file  . Transportation needs:    Medical: Not on file    Non-medical: Not on file  Tobacco Use  . Smoking status: Never Smoker  . Smokeless tobacco: Never Used  Substance and Sexual Activity  . Alcohol use: No  . Drug use: No  . Sexual activity: Not  Currently    Birth control/protection: None  Lifestyle  . Physical activity:    Days per week: Not on file    Minutes per session: Not on file  . Stress: Not on file  Relationships  . Social connections:    Talks on phone: Not on file    Gets together: Not on file    Attends religious service: Not on file    Active member of club or organization: Not on file    Attends meetings of clubs or organizations: Not on file    Relationship status: Not on file  .  Intimate partner violence:    Fear of current or ex partner: Not on file    Emotionally abused: Not on file    Physically abused: Not on file    Forced sexual activity: Not on file  Other Topics Concern  . Not on file  Social History Narrative  . Not on file  Engineer, agricultural,  From [place], has lived also in [place] Owns cats, no other pets.   Allergies  Allergen Reactions  . Ciprofloxacin Other (See Comments)    Tendon pain   . Clindamycin/Lincomycin Itching  . Doxycycline Itching  . Sulfa Antibiotics Itching and Nausea And Vomiting  . Penicillins Rash     Outpatient Medications Prior to Visit  Medication Sig Dispense Refill  . hydrocodone-ibuprofen (VICOPROFEN) 5-200 MG tablet Take 1 tablet by mouth every 8 (eight) hours as needed for pain.    Colleen Can 1/20 1-20 MG-MCG tablet Take 1 tablet by mouth daily. as directed  0  . zolpidem (AMBIEN) 10 MG tablet 5 mg.     No facility-administered medications prior to visit.         Objective:   Physical Exam Vitals:   02/07/18 1604  BP: 124/72  Pulse: 94  SpO2: 97%  Weight: 108 lb (49 kg)  Height: 4\' 11"  (1.499 m)   Gen: Pleasant, thin woman, in no distress,  normal affect  ENT: No lesions,  mouth clear,  oropharynx clear, no postnasal drip  Neck: No JVD, no stridor  Lungs: No use of accessory muscles, no crackles or wheezing on normal respiration, no wheeze on forced expiration  Cardiovascular: RRR, heart sounds normal, no murmur or gallops, no peripheral edema  Musculoskeletal: No deformities, no cyanosis or clubbing  Neuro: alert, awake, non focal  Skin: Warm, no lesions or rash       Assessment & Plan:  Dyspnea This appears to go hand-in-hand with her recurrent cough.  Pulmonary function testing shows no evidence for obstruction or asthma.  There is an apparent functional component here, she states that sometimes her episodes can be associated with weakness or even partial paralysis.  I cannot see any connection  between her cough, normal airflow in these type of symptoms.  I have talked her about trigger avoidance both with regard to her cough and her dyspnea.  I do not think methacholine challenge would be high yield.  Chronic cough She notes sensitivity to environmental exposures but also tells me that she does not have chronic rhinitis, has not responded in the past antihistamines or allergy medication.  To my knowledge she has not had skin testing.  There may be components of both allergies and GERD here but she denies these.  She is more concerned that there is environmental sensitivity, injury being caused by the fumes she inhales.  We did talk about using a mask, practicing avoidance.  It sounds like she will  be willing to do this.  I do not think she be willing to go see an allergist.  Levy Pupa, MD, PhD 02/07/2018, 4:52 PM [facility] [phone] or if no answer [phone]

## 2018-02-07 NOTE — Patient Instructions (Signed)
Your pulmonary function testing showed normal airflows without any evidence for asthma.  This is good news. Your chest x-ray is normal, does not show any evidence of cancer.  This is good news Your left-sided chest discomfort likely reflects musculoskeletal pain due to frequent and harsh coughing. You might benefit from taking antihistamines to blunt your sensitivity and cough response to environmental exposures such as fumes, perfume, smoke. Follow with Dr Delton Coombes if needed

## 2018-02-07 NOTE — Assessment & Plan Note (Signed)
This appears to go hand-in-hand with her recurrent cough.  Pulmonary function testing shows no evidence for obstruction or asthma.  There is an apparent functional component here, she states that sometimes her episodes can be associated with weakness or even partial paralysis.  I cannot see any connection between her cough, normal airflow in these type of symptoms.  I have talked her about trigger avoidance both with regard to her cough and her dyspnea.  I do not think methacholine challenge would be high yield.

## 2018-03-05 DIAGNOSIS — J069 Acute upper respiratory infection, unspecified: Secondary | ICD-10-CM | POA: Diagnosis not present

## 2018-03-13 DIAGNOSIS — J069 Acute upper respiratory infection, unspecified: Secondary | ICD-10-CM | POA: Diagnosis not present

## 2018-03-14 DIAGNOSIS — R509 Fever, unspecified: Secondary | ICD-10-CM | POA: Diagnosis not present

## 2018-03-14 DIAGNOSIS — J029 Acute pharyngitis, unspecified: Secondary | ICD-10-CM | POA: Diagnosis not present

## 2018-03-14 DIAGNOSIS — E860 Dehydration: Secondary | ICD-10-CM | POA: Diagnosis not present

## 2018-03-14 DIAGNOSIS — J069 Acute upper respiratory infection, unspecified: Secondary | ICD-10-CM | POA: Diagnosis not present

## 2018-06-13 ENCOUNTER — Emergency Department (HOSPITAL_COMMUNITY)
Admission: EM | Admit: 2018-06-13 | Discharge: 2018-06-14 | Disposition: A | Discharge status: Home / Self Care | Payer: BLUE CROSS/BLUE SHIELD | Attending: Emergency Medicine | Admitting: Emergency Medicine

## 2018-06-13 ENCOUNTER — Other Ambulatory Visit: Payer: Self-pay

## 2018-06-13 ENCOUNTER — Encounter (HOSPITAL_COMMUNITY): Payer: Self-pay

## 2018-06-13 DIAGNOSIS — Z79899 Other long term (current) drug therapy: Secondary | ICD-10-CM | POA: Insufficient documentation

## 2018-06-13 DIAGNOSIS — M797 Fibromyalgia: Secondary | ICD-10-CM | POA: Diagnosis not present

## 2018-06-13 DIAGNOSIS — R1010 Upper abdominal pain, unspecified: Secondary | ICD-10-CM | POA: Diagnosis not present

## 2018-06-13 DIAGNOSIS — R197 Diarrhea, unspecified: Secondary | ICD-10-CM | POA: Insufficient documentation

## 2018-06-13 DIAGNOSIS — R1012 Left upper quadrant pain: Secondary | ICD-10-CM | POA: Diagnosis not present

## 2018-06-13 DIAGNOSIS — R110 Nausea: Secondary | ICD-10-CM | POA: Diagnosis not present

## 2018-06-13 LAB — COMPREHENSIVE METABOLIC PANEL
ALT: 12 U/L (ref 0–44)
AST: 23 U/L (ref 15–41)
Albumin: 4.1 g/dL (ref 3.5–5.0)
Alkaline Phosphatase: 48 U/L (ref 38–126)
Anion gap: 11 (ref 5–15)
BUN: 6 mg/dL (ref 6–20)
CO2: 22 mmol/L (ref 22–32)
Calcium: 9 mg/dL (ref 8.9–10.3)
Chloride: 103 mmol/L (ref 98–111)
Creatinine, Ser: 0.6 mg/dL (ref 0.44–1.00)
GFR calc Af Amer: >60 mL/min (ref >60)
GFR calc non Af Amer: >60 mL/min (ref >60)
Glucose, Bld: 104 mg/dL — ABNORMAL HIGH (ref 70–99)
Potassium: 3.8 mmol/L (ref 3.5–5.1)
Sodium: 136 mmol/L (ref 135–145)
Total Bilirubin: 0.5 mg/dL (ref 0.3–1.2)
Total Protein: 7.4 g/dL (ref 6.5–8.1)

## 2018-06-13 LAB — URINALYSIS, ROUTINE W REFLEX MICROSCOPIC
Bilirubin Urine: NEGATIVE
Glucose, UA: NEGATIVE mg/dL
Hgb urine dipstick: NEGATIVE
Ketones, ur: NEGATIVE mg/dL
Leukocytes,Ua: NEGATIVE
Nitrite: NEGATIVE
Protein, ur: NEGATIVE mg/dL
Specific Gravity, Urine: 1.008 (ref 1.005–1.030)
pH: 6 (ref 5.0–8.0)

## 2018-06-13 LAB — CBC
HCT: 39.9 % (ref 36.0–46.0)
Hemoglobin: 13.4 g/dL (ref 12.0–15.0)
MCH: 31.8 pg (ref 26.0–34.0)
MCHC: 33.6 g/dL (ref 30.0–36.0)
MCV: 94.5 fL (ref 80.0–100.0)
Platelets: 318 10*3/uL (ref 150–400)
RBC: 4.22 MIL/uL (ref 3.87–5.11)
RDW: 12 % (ref 11.5–15.5)
WBC: 6.4 10*3/uL (ref 4.0–10.5)
nRBC: 0 % (ref 0.0–0.2)

## 2018-06-13 LAB — LIPASE, BLOOD: Lipase: 34 U/L (ref 11–51)

## 2018-06-13 LAB — I-STAT BETA HCG BLOOD, ED (MC, WL, AP ONLY): I-stat hCG, quantitative: <5 m[IU]/mL (ref <5)

## 2018-06-13 NOTE — ED Triage Notes (Signed)
Pt c/o severe abdominal pain since Friday. Has loose, green stool and continuously has a feeling of "fullness" in her stomach even after having a BM

## 2018-06-14 ENCOUNTER — Emergency Department (HOSPITAL_COMMUNITY): Payer: BLUE CROSS/BLUE SHIELD

## 2018-06-14 DIAGNOSIS — R197 Diarrhea, unspecified: Secondary | ICD-10-CM | POA: Diagnosis not present

## 2018-06-14 MED ORDER — SUCRALFATE 1 G PO TABS
1.0000 g | ORAL_TABLET | Freq: Once | ORAL | Status: AC
  Administered 2018-06-14: 1 g via ORAL
  Filled 2018-06-14: qty 1

## 2018-06-14 MED ORDER — IOHEXOL 300 MG/ML  SOLN
100.0000 mL | Freq: Once | INTRAMUSCULAR | Status: AC | PRN
  Administered 2018-06-14: 100 mL via INTRAVENOUS

## 2018-06-14 MED ORDER — ALUM & MAG HYDROXIDE-SIMETH 200-200-20 MG/5ML PO SUSP
30.0000 mL | Freq: Once | ORAL | Status: AC
  Administered 2018-06-14: 30 mL via ORAL
  Filled 2018-06-14: qty 30

## 2018-06-14 MED ORDER — MORPHINE SULFATE (PF) 4 MG/ML IV SOLN
4.0000 mg | Freq: Once | INTRAVENOUS | Status: AC
Start: 2018-06-14 — End: 2018-06-14
  Administered 2018-06-14: 4 mg via INTRAVENOUS
  Filled 2018-06-14: qty 1

## 2018-06-14 MED ORDER — SUCRALFATE 1 G PO TABS: 1.0000 g | ORAL_TABLET | Freq: Three times a day (TID) | ORAL | 0 refills | Status: DC

## 2018-06-14 MED ORDER — ONDANSETRON HCL 4 MG/2ML IJ SOLN
4.0000 mg | Freq: Once | INTRAMUSCULAR | Status: AC
  Administered 2018-06-14: 4 mg via INTRAVENOUS
  Filled 2018-06-14: qty 2

## 2018-06-14 MED ORDER — OMEPRAZOLE 20 MG PO CPDR: 20.0000 mg | DELAYED_RELEASE_CAPSULE | Freq: Every day | ORAL | 0 refills | Status: DC

## 2018-06-14 MED ORDER — ONDANSETRON 4 MG PO TBDP: 4.0000 mg | ORAL_TABLET | Freq: Three times a day (TID) | ORAL | 0 refills | Status: DC | PRN

## 2018-06-14 MED ORDER — LIDOCAINE VISCOUS HCL 2 % MT SOLN
15.0000 mL | Freq: Once | OROMUCOSAL | Status: AC
  Administered 2018-06-14: 15 mL via ORAL
  Filled 2018-06-14: qty 15

## 2018-06-14 MED ORDER — FAMOTIDINE IN NACL 20-0.9 MG/50ML-% IV SOLN
20.0000 mg | INTRAVENOUS | Status: AC
  Administered 2018-06-14: 20 mg via INTRAVENOUS
  Filled 2018-06-14: qty 50

## 2018-06-14 NOTE — ED Notes (Signed)
Patient transported to CT 

## 2018-06-14 NOTE — ED Notes (Signed)
Updated pts husband on plan of care.

## 2018-06-14 NOTE — ED Provider Notes (Signed)
[facility] EMERGENCY DEPARTMENT Provider Note   CSN: [id number] Arrival date & time: 06/13/18  2028    History   Chief Complaint Chief Complaint  Patient presents with  . Abdominal Pain    HPI Linda Mcbride is a 48 y.o. female.     The history is provided by the patient and medical records.  Abdominal Pain     48 y.o. F with hx of fibromyalgia, chronic cough, presenting to the ED for abdominal pain.  Patient reports this began on Saturday and has been steadily worsening.  She states pain throughout upper and mid-abdomen, severe, but coming in waves.  She reports nausea and increased pain when trying to eat.  She has been able to take in some fluids.  She reports loose, dark green diarrhea several times a day.  She denies melena or hematochezia.  States her abdomen feels full even after bowel movements.  Has had intermittent pains like this for a year or so but never this severe or lasting this long.  She states she was supposed to have endoscopy last year but "chickened out".  She has history of hernia surgery at age 45 but no other abdominal surgeries.  States she has been taking tylenol for pain along with some dietary supplements.  She has Vicodin prescribed PRN but rarely takes it.  No fever, chest pain, SOB, cough, sick contacts, or known COVID exposures.  Past Medical History:  Diagnosis Date  . Fibromyalgia   . Fibromyalgia   . Perforated nasal septum     Patient Active Problem List   Diagnosis Date Noted  . Chronic cough 01/31/2018  . Dyspnea 01/31/2018  . Low grade squamous intraepithelial lesion (LGSIL) on cervical Pap smear 09/04/2013    Past Surgical History:  Procedure Laterality Date  . HERNIA REPAIR    . SEPTOPLASTY    . TONSILLECTOMY    . WISDOM TOOTH EXTRACTION       OB History    Gravida  0   Para      Term      Preterm      AB      Living        SAB      TAB      Ectopic      Multiple      Live Births               Home Medications    Prior to Admission medications   Medication Sig Start Date End Date Taking? Authorizing Provider  hydrocodone-ibuprofen (VICOPROFEN) 5-200 MG tablet Take 1 tablet by mouth every 8 (eight) hours as needed for pain.    [provider]  JUNEL 1/20 1-20 MG-MCG tablet Take 1 tablet by mouth daily. as directed 01/07/16   [provider]  zolpidem (AMBIEN) 10 MG tablet 5 mg. 08/27/14   [provider]    Family History Family History  Problem Relation Age of Onset  . Cancer Maternal Grandmother        lung, thyroid  . Cancer Maternal Grandfather        lung and thyroid    Social History Social History   Tobacco Use  . Smoking status: Never Smoker  . Smokeless tobacco: Never Used  Substance Use Topics  . Alcohol use: No  . Drug use: No     Allergies   Ciprofloxacin; Clindamycin/lincomycin; Doxycycline; Sulfa antibiotics; and Penicillins   Review of Systems Review of Systems  Gastrointestinal:  Positive for abdominal pain.  All other systems reviewed and are negative.    Physical Exam Updated Vital Signs BP (!) 119/92 (BP Location: Right Arm)   Pulse 98   Temp 98.1 F (36.7 C) (Oral)   Resp 20   Ht 4\' 11"  (1.499 m)   Wt 45.4 kg   LMP 06/13/2018   SpO2 99%   BMI 20.20 kg/m   Physical Exam Vitals signs and nursing note reviewed.  Constitutional:      Appearance: She is well-developed.  HENT:     Head: Normocephalic and atraumatic.  Eyes:     Conjunctiva/sclera: Conjunctivae normal.     Pupils: Pupils are equal, round, and reactive to light.  Neck:     Musculoskeletal: Normal range of motion.  Cardiovascular:     Rate and Rhythm: Normal rate and regular rhythm.     Heart sounds: Normal heart sounds.  Pulmonary:     Effort: Pulmonary effort is normal.     Breath sounds: Normal breath sounds.  Abdominal:     General: Bowel sounds are normal.     Palpations: Abdomen is soft.     Tenderness: There is  abdominal tenderness. There is no guarding or rebound.    Musculoskeletal: Normal range of motion.  Skin:    General: Skin is warm and dry.  Neurological:     Mental Status: She is alert and oriented to person, place, and time.      ED Treatments / Results  Labs (all labs ordered are listed, but only abnormal results are displayed) Labs Reviewed  COMPREHENSIVE METABOLIC PANEL - Abnormal; Notable for the following components:      Result Value   Glucose, Bld 104 (*)    All other components within normal limits  URINALYSIS, ROUTINE W REFLEX MICROSCOPIC - Abnormal; Notable for the following components:   Color, Urine STRAW (*)    All other components within normal limits  LIPASE, BLOOD  CBC  I-STAT BETA HCG BLOOD, ED ([facility], [facility], [facility] ONLY)    EKG None  Radiology Ct Abdomen Pelvis W Contrast  Result Date: 06/14/2018 CLINICAL DATA:  48 year old female with severe abdominal pain for 5 days with diarrhea. EXAM: CT ABDOMEN AND PELVIS WITH CONTRAST TECHNIQUE: Multidetector CT imaging of the abdomen and pelvis was performed using the standard protocol following bolus administration of intravenous contrast. CONTRAST:  OMNIPAQUE IOHEXOL 300 MG/ML  SOLN COMPARISON:  None. FINDINGS: Lower chest: Negative. Hepatobiliary: Negative liver and gallbladder. Pancreas: Negative. Spleen: Negative. Adrenals/Urinary Tract: Normal adrenal glands. Mild right renal malrotation, normal variant. Bilateral renal enhancement and contrast excretion is symmetric and normal. Decompressed proximal ureters. There is some early IV contrast excretion to the urinary bladder which is diminutive and unremarkable. No urinary calculus is evident. Stomach/Bowel: Decompressed and negative rectosigmoid colon. Similar decompressed and negative descending colon. Redundant but otherwise negative transverse colon. Mild retained stool in the right colon. Normal retrocecal appendix (series 3, image 47. No large bowel inflammation.  Negative terminal ileum. Flocculated material in some of the distal small bowel, but no dilated small bowel loops. Negative stomach. No free air, free fluid. Vascular/Lymphatic: Major arterial structures are patent and appear normal. Portal venous system is patent. No lymphadenopathy. Reproductive: Enlarged and somewhat heterogeneous endometrium (sagittal image 81) up to 22 millimeters in thickness. Otherwise negative uterus and ovaries. Other: No pelvic free fluid. Musculoskeletal: Negative. IMPRESSION: 1. Enlarged and heterogeneous appearance of the endometrium is of unclear significance. Is there abnormal uterine bleeding? Recommend follow-up  transabdominal and transvaginal Ultrasound to further characterize. 2. Otherwise negative CT Abdomen and Pelvis. Normal appendix. Electronically Signed   By: Odessa Fleming M.D.   On: 06/14/2018 02:05    Procedures Procedures (including critical care time)  Medications Ordered in ED Medications  morphine 4 MG/ML injection 4 mg (4 mg Intravenous Given 06/14/18 0128)  ondansetron (ZOFRAN) injection 4 mg (4 mg Intravenous Given 06/14/18 0127)  iohexol (OMNIPAQUE) 300 MG/ML solution 100 mL (100 mLs Intravenous Contrast Given 06/14/18 0143)  famotidine (PEPCID) IVPB 20 mg premix (0 mg Intravenous Stopped 06/14/18 0400)  sucralfate (CARAFATE) tablet 1 g (1 g Oral Given 06/14/18 0330)  alum & mag hydroxide-simeth (MAALOX/MYLANTA) 200-200-20 MG/5ML suspension 30 mL (30 mLs Oral Given 06/14/18 0331)    And  lidocaine (XYLOCAINE) 2 % viscous mouth solution 15 mL (15 mLs Oral Given 06/14/18 0331)     Initial Impression / Assessment and Plan / ED Course  I have reviewed the triage vital signs and the nursing notes.  Pertinent labs & imaging results that were available during my care of the patient were reviewed by me and considered in my medical decision making (see chart for details).  47 year old female presenting to the ED with upper abdominal pain for the past few days.   States she has had intermittent episodes of the same previously but never this severe or lasting this long.  She is afebrile and nontoxic.  Pain localized to upper and mid abdomen with some mild tenderness.  She reports some nausea without vomiting as well as green stools.  No melena or hematochezia.  She does tell me that she was supposed to get an endoscopy last year for similar pain, but "chickened out" and canceled.  Her labs are overall reassuring.  We will plan for CT scan for further evaluation.  CT without any acute findings.  She does have enlarged and heterogeneous appearance of the endometrium, unknown significance.  She is currently on her menstrual cycle.  She does tell me that she has been taking birth control continuously for the past year and this is her first cycle--may account for some of the irregularity.  She was made aware of the recommendations for transabdominal and transvaginal ultrasound, however she declines.  States she has had this once before and it was "traumatic".  She prefers to follow-up with her OB/GYN as an outpatient.  As her pain is mostly upper abdomen, I doubt this is what is causing her acute pain so feel it is reasonable to have this done as an outpatient.  She may be suffering from gastritis.  Denies recent EtOH or NSAID use.  No history of PUD. States she has been eating more than normal and "not always healthy".  Pain has improved somewhat here but returned.  Will give dose of protonix, GI cocktail, carafate.  Patient somewhat improved after additional meds.  VS remain stable.  Feel she is stable for discharge.  I have recommended that she follow-up with her GI doctor for ongoing eval and likely will need to reschedule her endoscopy.  Will also need to follow-up with OB-GYN regarding uterine findings on CT since she declined [place] here.  She can return here for any new/acute changes.  Final Clinical Impressions(s) / ED Diagnoses   Final diagnoses:  Upper abdominal  pain    ED Discharge Orders         Ordered    sucralfate (CARAFATE) 1 g tablet  3 times daily with meals & bedtime  06/14/18 0422    omeprazole (PRILOSEC) 20 MG capsule  Daily     06/14/18 0422    ondansetron (ZOFRAN ODT) 4 MG disintegrating tablet  Every 8 hours PRN     06/14/18 0422           Garlon HatchetSanders, Lisa M, PA-C 06/14/18 [id number]    Shon BatonHorton, Courtney F, MD 06/14/18 [phone]

## 2018-06-14 NOTE — Discharge Instructions (Addendum)
Your work-up did not show any significant findings that are causing your symptoms.  I think this may represent gastritis (inflammation of lining of stomach).  I recommend to you contact your GI doctor for follow-up and reschedule your endoscopy. There were some abnormal findings with your uterus on CT and we recommended ultrasound but you declined.  I do recommend to follow-up with your OB-GYN to have this done. Take the prescribed medication as directed.  Avoid spicy/acidic foods-- tomatoes, chilis, citrus, etc. Return to the ED for new or worsening symptoms.

## 2018-06-14 NOTE — ED Notes (Signed)
Patient verbalizes understanding of discharge instructions. Opportunity for questioning and answers were provided. Armband removed by staff, pt discharged from ED ambulatory with husband to pick up   

## 2018-06-14 NOTE — ED Notes (Signed)
Pt's husband Ulrec [phone]) approached front desk and wanted an update on his wife.

## 2018-06-23 DIAGNOSIS — R102 Pelvic and perineal pain: Secondary | ICD-10-CM | POA: Diagnosis not present

## 2018-06-23 DIAGNOSIS — Z3041 Encounter for surveillance of contraceptive pills: Secondary | ICD-10-CM | POA: Diagnosis not present

## 2018-06-23 DIAGNOSIS — R87612 Low grade squamous intraepithelial lesion on cytologic smear of cervix (LGSIL): Secondary | ICD-10-CM | POA: Diagnosis not present

## 2018-06-23 DIAGNOSIS — R9389 Abnormal findings on diagnostic imaging of other specified body structures: Secondary | ICD-10-CM | POA: Diagnosis not present

## 2018-07-06 DIAGNOSIS — Z1211 Encounter for screening for malignant neoplasm of colon: Secondary | ICD-10-CM | POA: Diagnosis not present

## 2018-07-06 DIAGNOSIS — K2270 Barrett's esophagus without dysplasia: Secondary | ICD-10-CM | POA: Diagnosis not present

## 2018-07-12 DIAGNOSIS — M797 Fibromyalgia: Secondary | ICD-10-CM | POA: Insufficient documentation

## 2018-07-21 DIAGNOSIS — Z01419 Encounter for gynecological examination (general) (routine) without abnormal findings: Secondary | ICD-10-CM | POA: Diagnosis not present

## 2018-07-21 DIAGNOSIS — R102 Pelvic and perineal pain: Secondary | ICD-10-CM | POA: Diagnosis not present

## 2018-07-21 DIAGNOSIS — N882 Stricture and stenosis of cervix uteri: Secondary | ICD-10-CM | POA: Diagnosis not present

## 2018-07-21 DIAGNOSIS — Z3041 Encounter for surveillance of contraceptive pills: Secondary | ICD-10-CM | POA: Diagnosis not present

## 2018-07-21 DIAGNOSIS — R87612 Low grade squamous intraepithelial lesion on cytologic smear of cervix (LGSIL): Secondary | ICD-10-CM | POA: Diagnosis not present

## 2018-07-21 DIAGNOSIS — Z113 Encounter for screening for infections with a predominantly sexual mode of transmission: Secondary | ICD-10-CM | POA: Diagnosis not present

## 2018-07-21 DIAGNOSIS — Z3202 Encounter for pregnancy test, result negative: Secondary | ICD-10-CM | POA: Diagnosis not present

## 2018-07-21 DIAGNOSIS — R9389 Abnormal findings on diagnostic imaging of other specified body structures: Secondary | ICD-10-CM | POA: Diagnosis not present

## 2018-09-15 DIAGNOSIS — R102 Pelvic and perineal pain: Secondary | ICD-10-CM | POA: Diagnosis not present

## 2018-09-15 DIAGNOSIS — Z113 Encounter for screening for infections with a predominantly sexual mode of transmission: Secondary | ICD-10-CM | POA: Diagnosis not present

## 2018-09-15 DIAGNOSIS — R87612 Low grade squamous intraepithelial lesion on cytologic smear of cervix (LGSIL): Secondary | ICD-10-CM | POA: Diagnosis not present

## 2018-09-15 DIAGNOSIS — Z3041 Encounter for surveillance of contraceptive pills: Secondary | ICD-10-CM | POA: Diagnosis not present

## 2018-10-26 DIAGNOSIS — Z1331 Encounter for screening for depression: Secondary | ICD-10-CM | POA: Diagnosis not present

## 2018-10-26 DIAGNOSIS — K2970 Gastritis, unspecified, without bleeding: Secondary | ICD-10-CM | POA: Diagnosis not present

## 2018-10-26 DIAGNOSIS — F329 Major depressive disorder, single episode, unspecified: Secondary | ICD-10-CM | POA: Diagnosis not present

## 2018-10-26 DIAGNOSIS — R350 Frequency of micturition: Secondary | ICD-10-CM | POA: Diagnosis not present

## 2018-10-26 DIAGNOSIS — M797 Fibromyalgia: Secondary | ICD-10-CM | POA: Diagnosis not present

## 2018-11-17 DIAGNOSIS — K2970 Gastritis, unspecified, without bleeding: Secondary | ICD-10-CM | POA: Diagnosis not present

## 2018-11-17 DIAGNOSIS — R102 Pelvic and perineal pain: Secondary | ICD-10-CM | POA: Diagnosis not present

## 2018-11-17 DIAGNOSIS — Z1331 Encounter for screening for depression: Secondary | ICD-10-CM | POA: Diagnosis not present

## 2018-11-17 DIAGNOSIS — R062 Wheezing: Secondary | ICD-10-CM | POA: Diagnosis not present

## 2018-11-17 DIAGNOSIS — K2270 Barrett's esophagus without dysplasia: Secondary | ICD-10-CM | POA: Diagnosis not present

## 2018-11-27 DIAGNOSIS — R87612 Low grade squamous intraepithelial lesion on cytologic smear of cervix (LGSIL): Secondary | ICD-10-CM | POA: Diagnosis not present

## 2018-11-27 DIAGNOSIS — N870 Mild cervical dysplasia: Secondary | ICD-10-CM | POA: Diagnosis not present

## 2018-11-27 DIAGNOSIS — Z3202 Encounter for pregnancy test, result negative: Secondary | ICD-10-CM | POA: Diagnosis not present

## 2020-06-14 ENCOUNTER — Emergency Department (HOSPITAL_COMMUNITY): Payer: 59

## 2020-06-14 ENCOUNTER — Emergency Department (HOSPITAL_COMMUNITY)
Admission: EM | Admit: 2020-06-14 | Discharge: 2020-06-14 | Disposition: A | Discharge status: Home / Self Care | Payer: 59 | Attending: Emergency Medicine | Admitting: Emergency Medicine

## 2020-06-14 ENCOUNTER — Encounter (HOSPITAL_COMMUNITY): Payer: Self-pay | Admitting: *Deleted

## 2020-06-14 ENCOUNTER — Other Ambulatory Visit: Payer: Self-pay

## 2020-06-14 DIAGNOSIS — R102 Pelvic and perineal pain: Secondary | ICD-10-CM | POA: Insufficient documentation

## 2020-06-14 DIAGNOSIS — R3911 Hesitancy of micturition: Secondary | ICD-10-CM | POA: Insufficient documentation

## 2020-06-14 DIAGNOSIS — R39198 Other difficulties with micturition: Secondary | ICD-10-CM

## 2020-06-14 DIAGNOSIS — R1013 Epigastric pain: Secondary | ICD-10-CM | POA: Diagnosis not present

## 2020-06-14 HISTORY — DX: Gastritis, unspecified, without bleeding: K29.70

## 2020-06-14 LAB — URINALYSIS, ROUTINE W REFLEX MICROSCOPIC
Bilirubin Urine: NEGATIVE
Glucose, UA: NEGATIVE mg/dL
Hgb urine dipstick: NEGATIVE
Ketones, ur: NEGATIVE mg/dL
Leukocytes,Ua: NEGATIVE
Nitrite: NEGATIVE
Protein, ur: NEGATIVE mg/dL
Specific Gravity, Urine: 1.012 (ref 1.005–1.030)
pH: 9 — ABNORMAL HIGH (ref 5.0–8.0)

## 2020-06-14 LAB — COMPREHENSIVE METABOLIC PANEL
ALT: 19 U/L (ref 0–44)
AST: 20 U/L (ref 15–41)
Albumin: 3.8 g/dL (ref 3.5–5.0)
Alkaline Phosphatase: 55 U/L (ref 38–126)
Anion gap: 8 (ref 5–15)
BUN: 5 mg/dL — ABNORMAL LOW (ref 6–20)
CO2: 22 mmol/L (ref 22–32)
Calcium: 8.8 mg/dL — ABNORMAL LOW (ref 8.9–10.3)
Chloride: 106 mmol/L (ref 98–111)
Creatinine, Ser: 0.53 mg/dL (ref 0.44–1.00)
GFR, Estimated: >60 mL/min (ref >60)
Glucose, Bld: 99 mg/dL (ref 70–99)
Potassium: 4 mmol/L (ref 3.5–5.1)
Sodium: 136 mmol/L (ref 135–145)
Total Bilirubin: 0.9 mg/dL (ref 0.3–1.2)
Total Protein: 6.9 g/dL (ref 6.5–8.1)

## 2020-06-14 LAB — CBC
HCT: 40.7 % (ref 36.0–46.0)
Hemoglobin: 13.5 g/dL (ref 12.0–15.0)
MCH: 31.6 pg (ref 26.0–34.0)
MCHC: 33.2 g/dL (ref 30.0–36.0)
MCV: 95.3 fL (ref 80.0–100.0)
Platelets: 291 10*3/uL (ref 150–400)
RBC: 4.27 MIL/uL (ref 3.87–5.11)
RDW: 12.4 % (ref 11.5–15.5)
WBC: 5.6 10*3/uL (ref 4.0–10.5)
nRBC: 0 % (ref 0.0–0.2)

## 2020-06-14 LAB — I-STAT BETA HCG BLOOD, ED (MC, WL, AP ONLY): I-stat hCG, quantitative: <5 m[IU]/mL (ref <5)

## 2020-06-14 LAB — LIPASE, BLOOD: Lipase: 34 U/L (ref 11–51)

## 2020-06-14 MED ORDER — MORPHINE SULFATE (PF) 4 MG/ML IV SOLN
4.0000 mg | Freq: Once | INTRAVENOUS | Status: DC
  Filled 2020-06-14: qty 1

## 2020-06-14 MED ORDER — ONDANSETRON 4 MG PO TBDP: 4.0000 mg | ORAL_TABLET | Freq: Three times a day (TID) | ORAL | 0 refills | Status: DC | PRN

## 2020-06-14 MED ORDER — OMEPRAZOLE 20 MG PO CPDR: 20.0000 mg | DELAYED_RELEASE_CAPSULE | Freq: Every day | ORAL | 0 refills | Status: DC

## 2020-06-14 MED ORDER — LIDOCAINE VISCOUS HCL 2 % MT SOLN: 15.0000 mL | OROMUCOSAL | 0 refills | Status: DC | PRN

## 2020-06-14 MED ORDER — ONDANSETRON HCL 4 MG/2ML IJ SOLN
4.0000 mg | Freq: Once | INTRAMUSCULAR | Status: DC
  Filled 2020-06-14: qty 2

## 2020-06-14 MED ORDER — LIDOCAINE VISCOUS HCL 2 % MT SOLN
15.0000 mL | Freq: Once | OROMUCOSAL | Status: AC
  Administered 2020-06-14: 15 mL via ORAL
  Filled 2020-06-14: qty 15

## 2020-06-14 MED ORDER — ALUM & MAG HYDROXIDE-SIMETH 200-200-20 MG/5ML PO SUSP
30.0000 mL | Freq: Once | ORAL | Status: AC
  Administered 2020-06-14: 30 mL via ORAL
  Filled 2020-06-14: qty 30

## 2020-06-14 MED ORDER — IOHEXOL 9 MG/ML PO SOLN
ORAL | Status: AC
  Filled 2020-06-14: qty 1000

## 2020-06-14 MED ORDER — ALUMINUM-MAGNESIUM-SIMETHICONE 200-200-20 MG/5ML PO SUSP: 30.0000 mL | Freq: Three times a day (TID) | ORAL | 0 refills | Status: DC

## 2020-06-14 MED ORDER — SUCRALFATE 1 G PO TABS: 1.0000 g | ORAL_TABLET | Freq: Three times a day (TID) | ORAL | 0 refills | Status: AC

## 2020-06-14 MED ORDER — FENTANYL CITRATE (PF) 100 MCG/2ML IJ SOLN: 100.0000 ug | Freq: Once | INTRAMUSCULAR | Status: DC

## 2020-06-14 NOTE — ED Provider Notes (Signed)
[facility] EMERGENCY DEPARTMENT Provider Note   CSN: [id number] Arrival date & time: 06/14/20  1208     History Chief Complaint  Patient presents with  . Abdominal Pain    Linda Mcbride is a 50 y.o. female presents to the ED with her husband for evaluation of abdominal pain.  She has pain in her epigastrium as well as in her "vaginal area" and lower abdomen.  This pain started 1 week ago.  Reports long history of vaginal and lower abdominal pain.  Has seen her gynecologist for this who has recommended pelvic floor physical therapy.  She did not want to follow through with this however.  She has had pelvic ultrasounds that have been normal.  States that her last Pap showed HPV.  Has never been pregnant.  Has had chronic, intermittent burning with urination, urinary frequency, feeling like she is not emptying her bladder.  She has lower abdominal and vaginal discomfort with this as well.  She is sexually active rarely with her husband and is not concerned about an STD.  States that the symptoms had improved for a long time but just returned 1 week ago.  She has never had associated back pain with it and is worried that something else is going on.  Describes her epigastric abdominal pain as burning.  Also reports coming to the ED several years ago and told that she had gastritis.  She was discharged with an antiacid medicine that she took for a long time and her pain eventually resolved so she stopped taking this medicine.  Husband states that she is always nauseated after eating.  She denies ibuprofen or aspirin use, alcohol use, marijuana use.  Has associated nausea but no vomiting.  No chest pain, shortness of breath.  No blood in her stool or melena.  States she had an endoscopy 2 years ago and was told that she had a hiatal hernia and something wrong in her esophagus.  HPI     Past Medical History:  Diagnosis Date  . Fibromyalgia   . Fibromyalgia   . Gastritis   .  Perforated nasal septum     Patient Active Problem List   Diagnosis Date Noted  . Chronic cough 01/31/2018  . Dyspnea 01/31/2018  . Low grade squamous intraepithelial lesion (LGSIL) on cervical Pap smear 09/04/2013    Past Surgical History:  Procedure Laterality Date  . HERNIA REPAIR    . SEPTOPLASTY    . TONSILLECTOMY    . WISDOM TOOTH EXTRACTION       OB History    Gravida  0   Para      Term      Preterm      AB      Living        SAB      IAB      Ectopic      Multiple      Live Births              Family History  Problem Relation Age of Onset  . Cancer Maternal Grandmother        lung, thyroid  . Cancer Maternal Grandfather        lung and thyroid    Social History   Tobacco Use  . Smoking status: Never Smoker  . Smokeless tobacco: Never Used  Substance Use Topics  . Alcohol use: No  . Drug use: No    Home Medications Prior to Admission  medications   Medication Sig Start Date End Date Taking? Authorizing Provider  omeprazole (PRILOSEC) 20 MG capsule Take 1 capsule (20 mg total) by mouth daily. Patient not taking: Reported on 06/14/2020 06/14/18   Garlon Hatchet, PA-C  ondansetron (ZOFRAN ODT) 4 MG disintegrating tablet Take 1 tablet (4 mg total) by mouth every 8 (eight) hours as needed for nausea. Patient not taking: Reported on 06/14/2020 06/14/18   Garlon Hatchet, PA-C  sucralfate (CARAFATE) 1 g tablet Take 1 tablet (1 g total) by mouth 4 (four) times daily -  with meals and at bedtime. Patient not taking: Reported on 06/14/2020 06/14/18   Garlon Hatchet, PA-C    Allergies    Cephalexin, Ciprofloxacin, Clindamycin/lincomycin, Doxycycline, Metronidazole, Sulfa antibiotics, Penicillins, and Shrimp (diagnostic)  Review of Systems   Review of Systems  Gastrointestinal: Positive for abdominal pain and nausea.  Genitourinary: Positive for difficulty urinating.  All other systems reviewed and are negative.   Physical Exam Updated  Vital Signs BP (!) 140/97   Pulse (!) 25   Temp 98.7 F (37.1 C) (Oral)   Resp (!) 21   Ht 4\' 11"  (1.499 m)   Wt 48.1 kg   LMP 06/08/2020   SpO2 91%   BMI 21.41 kg/m   Physical Exam Vitals and nursing note reviewed.  Constitutional:      Appearance: She is well-developed.     Comments: Non toxic in NAD  HENT:     Head: Normocephalic and atraumatic.     Nose: Nose normal.  Eyes:     Conjunctiva/sclera: Conjunctivae normal.  Cardiovascular:     Rate and Rhythm: Normal rate and regular rhythm.  Pulmonary:     Effort: Pulmonary effort is normal.     Breath sounds: Normal breath sounds.  Abdominal:     General: Bowel sounds are normal.     Palpations: Abdomen is soft.     Tenderness: There is abdominal tenderness in the epigastric area and suprapubic area. There is right CVA tenderness and left CVA tenderness.     Comments: No G/R/R. Negative Murphy's and McBurney's. Active BS to lower quadrants.   Musculoskeletal:        General: Normal range of motion.     Cervical back: Normal range of motion.  Skin:    General: Skin is warm and dry.     Capillary Refill: Capillary refill takes less than 2 seconds.  Neurological:     Mental Status: She is alert.  Psychiatric:        Behavior: Behavior normal.     ED Results / Procedures / Treatments   Labs (all labs ordered are listed, but only abnormal results are displayed) Labs Reviewed  COMPREHENSIVE METABOLIC PANEL - Abnormal; Notable for the following components:      Result Value   BUN 5 (*)    Calcium 8.8 (*)    All other components within normal limits  URINALYSIS, ROUTINE W REFLEX MICROSCOPIC - Abnormal; Notable for the following components:   APPearance TURBID (*)    pH 9.0 (*)    Bacteria, UA RARE (*)    All other components within normal limits  LIPASE, BLOOD  CBC  I-STAT BETA HCG BLOOD, ED ([facility], [facility], [facility] ONLY)    EKG None  Radiology No results found.  Procedures Procedures   Medications Ordered in  ED Medications  morphine 4 MG/ML injection 4 mg (0 mg Intravenous Hold 06/14/20 1453)  ondansetron (ZOFRAN) injection 4 mg (0 mg  Intravenous Hold 06/14/20 1454)  fentaNYL (SUBLIMAZE) injection 100 mcg (0 mcg Intravenous Hold 06/14/20 1454)  iohexol (OMNIPAQUE) 9 MG/ML oral solution (has no administration in time range)  alum & mag hydroxide-simeth (MAALOX/MYLANTA) 200-200-20 MG/5ML suspension 30 mL (30 mLs Oral Given 06/14/20 1413)    And  lidocaine (XYLOCAINE) 2 % viscous mouth solution 15 mL (15 mLs Oral Given 06/14/20 1413)    ED Course  I have reviewed the triage vital signs and the nursing notes.  Pertinent labs & imaging results that were available during my care of the patient were reviewed by me and considered in my medical decision making (see chart for details).  Clinical Course as of 06/14/20 1613  Sat Jun 14, 2020  1329 Appearance(!): TURBID [CG]  1329 Bacteria, UA(!): RARE [CG]  1329 Squamous Epithelial / LPF: 0-5 [CG]  1507 UTI sx x 1 week, UA negative, LLQ, epi pain. Pending CT oral, Likely dc home if neg, FU on dipo, pain. PPI, Zofran, Tylenol [BH]    Clinical Course User Index [BH] Henderly, Britni A, PA-C [CG] Liberty HandyGibbons, Olen Eaves J, PA-C   MDM Rules/Calculators/A&P                           50 y.o. yo female presents to the ED for what sounds like acute on chronic abdominal pain.  She describes 2 separate abdominal pains.  Reports burning discomfort in her epigastrium-history of the same several years ago.  This pain had resolved with antiacid medicines that she stopped taking.  Has postprandial nausea.  Also reports acute on chronic suprapubic and vaginal discomfort that she has had for a long time.  Has seen gynecology for this, reports having several pelvic ultrasounds that have been negative.  Additional information obtained from chart, nursing and triage notes review  Chart review reveals -she had a recent Pap smear that showed low-grade squamous intraepithelial  neoplasia, HPV and patient is now on surveillance.  Gynecology recommended pelvic floor physical therapy but she has declined this.  States she had an endoscopy 2 years ago but I am unable to see this on her medical chart-states that she had a hiatal hernia and something wrong with her esophagus.  Ordered lab, imaging were personally reviewed and interpreted  Labs reveal -vastly reassuring.  Urinalysis with rare bacteria only but some crystals.  Normal WBC, hemoglobin, LFTs, and lipase.  Pregnancy test is negative  Imaging reveals -given urinary symptoms with normal urinalysis, back/flank pain will obtain a CT scan.    Medicines ordered -GI cocktail, fentanyl, Zofran  ED course & MDM 1611: Patient reevaluated after GI cocktail and reports significant improvement in epigastric abdominal pain.  She would like a prescription for the suspension she was given to use at home.  High suspicion for gastritis/uncontrolled GERD causing epigastric abdominal pain.  We will discharged with PPI, Carafate and lidocaine suspension's.  States she has had GI evaluation-recommended follow-up if her epigastric abdominal pain does not improve with these medicines.  Patient states she has been walking to the bathroom several times because she has urge to urinate but has minimal urine output.  States the symptoms are similar to the symptoms she has had in the past.  Has never been diagnosed with interstitial cystitis or seen a urologist.  Patient is awaiting CT scan.  Patient care transferred to oncoming ED PA who will follow up on CT scan, update patient.  Anticipate discharge with PPI, Carafate, lidocaine, urology follow-up.  Explained plan to patient who is in agreement with this.  Portions of this note were generated with Scientist, clinical (histocompatibility and immunogenetics). Dictation errors may occur despite best attempts at proofreading    Final Clinical Impression(s) / ED Diagnoses Final diagnoses:  Epigastric abdominal pain  Difficulty  in urination    Rx / DC Orders ED Discharge Orders    None       Jerrell Mylar 06/14/20 1613    Gerhard Munch, MD 06/15/20 [phone]

## 2020-06-14 NOTE — Discharge Instructions (Signed)
Return for new or worsening symptoms

## 2020-06-14 NOTE — ED Provider Notes (Signed)
Care transferred from previous provider at shift change.  See note for full HPI.  In summation 50 year old here for evaluation of abdominal pain.  Pain started 1 week ago.  She has symptoms consistent with UTI type symptoms.  Sick something previously.  No prior history of interstitial cystitis.   Patient also with epigastric pain.  Was prescribed PPI however no longer on this.  Her pain improved after GI cocktail here in ED.   Labs without any significant findings.  UA not consistent with UTI.    Plan on follow-up with CT scan.  Anticipate a CT scan without any significant findings, follow-up with GI for epigastric pain if not resolved with PPI, GI cocktail, follow-up with PCP for urinary complaints, possibly needs urology referral if concern for interstitial cystitis Physical Exam  BP (!) 140/97   Pulse 75   Temp 98.7 F (37.1 C) (Oral)   Resp 14   Ht 4\' 11"  (1.499 m)   Wt 48.1 kg   LMP 06/08/2020   SpO2 98%   BMI 21.41 kg/m   Physical Exam Vitals and nursing note reviewed.  Constitutional:      General: She is not in acute distress.    Appearance: She is well-developed. She is not diaphoretic.  HENT:     Head: Atraumatic.  Eyes:     Pupils: Pupils are equal, round, and reactive to light.  Cardiovascular:     Rate and Rhythm: Normal rate and regular rhythm.  Pulmonary:     Effort: Pulmonary effort is normal. No respiratory distress.  Abdominal:     General: There is no distension.     Palpations: Abdomen is soft.  Musculoskeletal:        General: Normal range of motion.     Cervical back: Normal range of motion and neck supple.  Skin:    General: Skin is warm and dry.  Neurological:     Mental Status: She is alert.     ED Course/Procedures   Clinical Course as of 06/14/20 1821  Sat Jun 14, 2020  1329 Appearance(!): TURBID [CG]  1329 Bacteria, UA(!): RARE [CG]  1329 Squamous Epithelial / LPF: 0-5 [CG]  1507 UTI sx x 1 week, UA negative, LLQ, epi pain. Pending  CT oral, Likely dc home if neg, FU on dipo, pain. PPI, Zofran, Tylenol [BH]    Clinical Course User Index [BH] Somaly Marteney A, PA-C [CG] Jun 16, 2020, PA-C    Procedures Labs Reviewed  COMPREHENSIVE METABOLIC PANEL - Abnormal; Notable for the following components:      Result Value   BUN 5 (*)    Calcium 8.8 (*)    All other components within normal limits  URINALYSIS, ROUTINE W REFLEX MICROSCOPIC - Abnormal; Notable for the following components:   APPearance TURBID (*)    pH 9.0 (*)    Bacteria, UA RARE (*)    All other components within normal limits  LIPASE, BLOOD  CBC  I-STAT BETA HCG BLOOD, ED ([facility], [facility], [facility] ONLY)  CT ABDOMEN PELVIS WO CONTRAST  Result Date: 06/14/2020 CLINICAL DATA:  Left flank pain, epigastric pain, frequent urination EXAM: CT ABDOMEN AND PELVIS WITHOUT CONTRAST TECHNIQUE: Multidetector CT imaging of the abdomen and pelvis was performed following the standard protocol without IV contrast. COMPARISON:  CT 06/14/2018 FINDINGS: Lower chest: Included lung bases are clear.  Heart size is normal. Hepatobiliary: Unremarkable unenhanced appearance of the liver. No focal liver lesion identified. Gallbladder within normal limits. No hyperdense gallstone. No  biliary dilatation. Pancreas: Unremarkable. No pancreatic ductal dilatation or surrounding inflammatory changes. Spleen: Normal in size without focal abnormality. Adrenals/Urinary Tract: Unremarkable adrenal glands. Bilateral kidneys have an unremarkable unenhanced appearance without evidence of focal lesion, stone, or hydronephrosis. Ureters are nondilated. No discrete ureteral stone is evident. Urinary bladder appears unremarkable. Stomach/Bowel: Oral contrast is present throughout the bowel. Stomach appears within normal limits. No dilated loops of bowel. A normal appendix is present in the right lower quadrant (series 3, image 47). No focal bowel wall thickening or inflammatory changes. Vascular/Lymphatic: No  significant vascular findings are evident on non contrasted study. No enlarged abdominal or pelvic lymph nodes. Reproductive: Uterus and bilateral adnexa are within normal limits. Other: No free fluid. No abdominopelvic fluid collection. No pneumoperitoneum. No abdominal wall hernia. Musculoskeletal: No acute or significant osseous findings. IMPRESSION: No acute abdominopelvic findings. Specifically, no evidence of obstructive uropathy. Electronically Signed   By: Duanne Guess D.O.   On: 06/14/2020 17:30   MDM  Plan on follow-up on CT scan and dispo appropriately  CT scan without acute abnormality   Pain improved.   Urinary complaints>>Unclear etiology of her urinary complaints.  Needs follow-up with PCP to rule out interstitial cystitis if symptoms do not improve given she has had this previously.  Previous provider did not think symptoms were GYN related, does not need pelvic exam.  Epigastric pain>> likely gastritis, GERD.  Low suspicion for atypical cardiac or pulmonary etiology.  Will treat with PPI, Carafate, lidocaine suspension     Adithi Gammon A, PA-C 06/14/20 1821    Charlynne Pander, MD 06/17/20 [phone]

## 2020-06-14 NOTE — ED Triage Notes (Signed)
Pt c/o frequent urination, suprapubic pressure, LUQ pressure.  Pt also c/o constipation for 2 weeks that became diarrhea yesterday.

## 2020-07-11 ENCOUNTER — Encounter: Payer: Self-pay | Admitting: Nurse Practitioner

## 2020-08-07 ENCOUNTER — Ambulatory Visit: Payer: 59 | Admitting: Nurse Practitioner

## 2020-08-07 ENCOUNTER — Encounter: Payer: Self-pay | Admitting: Nurse Practitioner

## 2020-08-07 VITALS — BP 118/68 | HR 58 | Ht 59.0 in | Wt 103.0 lb

## 2020-08-07 DIAGNOSIS — R1012 Left upper quadrant pain: Secondary | ICD-10-CM | POA: Diagnosis not present

## 2020-08-07 DIAGNOSIS — K2270 Barrett's esophagus without dysplasia: Secondary | ICD-10-CM | POA: Diagnosis not present

## 2020-08-07 DIAGNOSIS — K22719 Barrett's esophagus with dysplasia, unspecified: Secondary | ICD-10-CM

## 2020-08-07 DIAGNOSIS — R1013 Epigastric pain: Secondary | ICD-10-CM

## 2020-08-07 NOTE — Patient Instructions (Signed)
If you are age 52 or younger, your body mass index should be between 19-25. Your Body mass index is 20.8 kg/m. If this is out of the aformentioned range listed, please consider follow up with your Primary Care Provider.   PROCEDURES: You have been scheduled for a EGD. Please follow the written instructions given to you at your visit today. If you use inhalers (even only as needed), please bring them with you on the day of your procedure.  Please call us with the name of the medication you are taking for acid/heartburn. Please follow the GERD diet information. It was great seeing you today! Thank you for entrusting me with your care and choosing [facility].  Arnaldo Natal, CRNP  Gastroesophageal Reflux Disease, Adult  Gastroesophageal reflux (GER) happens when acid from the stomach flows up into the tube that connects the mouth and the stomach (esophagus). Normally, food travels down the esophagus and stays in the stomach to be digested. With GER, food and stomach acid sometimes move back up into theesophagus. You may have a disease called gastroesophageal reflux disease (GERD) if the reflux: Happens often. Causes frequent or very bad symptoms. Causes problems such as damage to the esophagus. When this happens, the esophagus becomes sore and swollen. Over time, GERD can make small holes (ulcers) in the lining of the esophagus. What are the causes? This condition is caused by a problem with the muscle between the esophagus and the stomach. When this muscle is weak or not normal, it does not close properlyto keep food and acid from coming back up from the stomach. The muscle can be weak because of: Tobacco use. Pregnancy. Having a certain type of hernia (hiatal hernia). Alcohol use. Certain foods and drinks, such as coffee, chocolate, onions, and peppermint. What increases the risk? Being overweight. Having a disease that affects your connective tissue. Taking  NSAIDs, such a ibuprofen. What are the signs or symptoms? Heartburn. Difficult or painful swallowing. The feeling of having a lump in the throat. A bitter taste in the mouth. Bad breath. Having a lot of saliva. Having an upset or bloated stomach. Burping. Chest pain. Different conditions can cause chest pain. Make sure you see your doctor if you have chest pain. Shortness of breath or wheezing. A long-term cough or a cough at night. Wearing away of the surface of teeth (tooth enamel). Weight loss. How is this treated? Making changes to your diet. Taking medicine. Having surgery. Treatment will depend on how bad your symptoms are. Follow these instructions at home: Eating and drinking  Follow a diet as told by your doctor. You may need to avoid foods and drinks such as: Coffee and tea, with or without caffeine. Drinks that contain alcohol. Energy drinks and sports drinks. Bubbly (carbonated) drinks or sodas. Chocolate and cocoa. Peppermint and mint flavorings. Garlic and onions. Horseradish. Spicy and acidic foods. These include peppers, chili powder, curry powder, vinegar, hot sauces, and BBQ sauce. Citrus fruit juices and citrus fruits, such as oranges, lemons, and limes. Tomato-based foods. These include red sauce, chili, salsa, and pizza with red sauce. Fried and fatty foods. These include donuts, french fries, potato chips, and high-fat dressings. High-fat meats. These include hot dogs, rib eye steak, sausage, ham, and bacon. High-fat dairy items, such as whole milk, butter, and cream cheese. Eat small meals often. Avoid eating large meals. Avoid drinking large amounts of liquid with your meals. Avoid eating meals during the 2-3 hours before bedtime. Avoid lying down  right after you eat. Do not exercise right after you eat.  Lifestyle  Do not smoke or use any products that contain nicotine or tobacco. If you need help quitting, ask your doctor. Try to lower your  stress. If you need help doing this, ask your doctor. If you are overweight, lose an amount of weight that is healthy for you. Ask your doctor about a safe weight loss goal.  General instructions Pay attention to any changes in your symptoms. Take over-the-counter and prescription medicines only as told by your doctor. Do not take aspirin, ibuprofen, or other NSAIDs unless your doctor says it is okay. Wear loose clothes. Do not wear anything tight around your waist. Raise (elevate) the head of your bed about 6 inches (15 cm). You may need to use a wedge to do this. Avoid bending over if this makes your symptoms worse. Keep all follow-up visits. Contact a doctor if: You have new symptoms. You lose weight and you do not know why. You have trouble swallowing or it hurts to swallow. You have wheezing or a cough that keeps happening. You have a hoarse voice. Your symptoms do not get better with treatment. Get help right away if: You have sudden pain in your arms, neck, jaw, teeth, or back. You suddenly feel sweaty, dizzy, or light-headed. You have chest pain or shortness of breath. You vomit and the vomit is green, yellow, or black, or it looks like blood or coffee grounds. You faint. Your poop (stool) is red, bloody, or black. You cannot swallow, drink, or eat. These symptoms may represent a serious problem that is an emergency. Do not wait to see if the symptoms will go away. Get medical help right away. Call your local emergency services (911 in the [place] Do not drive yourself to the hospital. Summary If a person has gastroesophageal reflux disease (GERD), food and stomach acid move back up into the esophagus and cause symptoms or problems such as damage to the esophagus. Treatment will depend on how bad your symptoms are. Follow a diet as told by your doctor. Take all medicines only as told by your doctor. This information is not intended to replace advice given to you by your health  care provider. Make sure you discuss any questions you have with your healthcare provider. Document Revised: 07/23/2019 Document Reviewed: 07/23/2019 Elsevier Patient Education  2022 [employer].

## 2020-08-07 NOTE — Progress Notes (Signed)
08/07/2020 Linda Mcbride [id number] [birth year]   CHIEF COMPLAINT: Nausea, stomach pain   HISTORY OF PRESENT ILLNESS: Linda Mcbride is a 50 year old female with a past medical history of fibromyalgia, GERD and Barrett's esophagus. She presents to our office today as referred by Britni A. Henderly PA-C for further evaluation regarding upper abdominal pain.    She complains of having significant epigastric and left upper quadrant pain mid May 2022. At that time, she felt as if it was difficult to chew her food, difficult to swallow and felt like her throat was tight but food did not get stuck in her esophagus.  She also had epigastric and left upper quadrant abdominal pain which was worse when after eating a larger meal otherwise no specific food triggers.  Her abdominal pain worsened so she presented to [facility] ED on 06/14/2020 for further evaluation.  She also noted having vaginal pain at that time and was referred to gynecology.  She received a GI cocktail, Fentanyl and Zofran and her abdominal pain significantly improved. CTAP wo contrast was unrevealing.  She was discharged home on Omeprazole 20 mg daily and Carafate 1 g 4 times daily.  Her epigastric pain was worse after she took the Omeprazole so she stopped taking it.  She continued taking Carafate and her upper abdominal pain improved.  She recently reduce the Carafate to 1 tab twice daily.  She stated she is taking another acid reducing medication possibly prescribed by her PCP but she cannot recall the name of this medication at this time. She has intermittent nausea without vomiting.  No NSAID use.  She is passing a small hard stool 2-3 times daily.  No rectal bleeding or melena. She denies ever completing a screening colonoscopy.  No known family history of esophageal, gastric or colon cancer.  She does not wish to pursue a screening colonoscopy at this time. Her appetite has improved.  No weight loss.  Her husband is  present.  She reported having similar epigastric and left upper abdominal pain in 2020 and possibly passed a few black stools.  At that time, she was followed by gastroenterologist Dr. Eleanora Neighbor at [facility]. She underwent an EGD 06/16/2018 by Dr. Cloretta Ned which showed a small hiatal hernia, gastritis, irregular Z-line and a normal duodenum.  The patient stated biopsies were consistent with Barrett's esophagus, no evidence of H. pylori.  I have requested a copy of her EGD biopsy results for further review.    CBC Latest Ref Rng & Units 06/14/2020 06/13/2018 05/31/2017  WBC 4.0 - 10.5 K/uL 5.6 6.4 5.2  Hemoglobin 12.0 - 15.0 g/dL 98.9 21.1 94.1  Hematocrit 36.0 - 46.0 % 40.7 39.9 41.4  Platelets 150 - 400 K/uL 291 318 313     CMP Latest Ref Rng & Units 06/14/2020 06/13/2018 05/31/2017  Glucose 70 - 99 mg/dL 99 740(C) 144(Y)  BUN 6 - 20 mg/dL 5(L) 6 8  Creatinine 1.85 - 1.00 mg/dL 6.31 4.97 0.26  Sodium 135 - 145 mmol/L 136 136 138  Potassium 3.5 - 5.1 mmol/L 4.0 3.8 3.8  Chloride 98 - 111 mmol/L 106 103 106  CO2 22 - 32 mmol/L 22 22 27   Calcium 8.9 - 10.3 mg/dL ) 9.0 3.7(C)  Total Protein 6.5 - 8.1 g/dL 6.9 7.4 -  Total Bilirubin 0.3 - 1.2 mg/dL 0.9 0.5 -  Alkaline Phos 38 - 126 U/L 55 48 -  AST 15 - 41 U/L 20 23 -  ALT 0 - 44 U/L 19 12 -  Lipase 34.  CTAP without contrast 06/14/2020: Lower chest: Included lung bases are clear.  Heart size is normal.   Hepatobiliary: Unremarkable unenhanced appearance of the liver. No focal liver lesion identified. Gallbladder within normal limits. No hyperdense gallstone. No biliary dilatation.   Pancreas: Unremarkable. No pancreatic ductal dilatation or surrounding inflammatory changes.   Spleen: Normal in size without focal abnormality.   Adrenals/Urinary Tract: Unremarkable adrenal glands. Bilateral kidneys have an unremarkable unenhanced appearance without evidence of focal lesion, stone, or hydronephrosis. Ureters are  nondilated. No discrete ureteral stone is evident. Urinary bladder appears unremarkable.   Stomach/Bowel: Oral contrast is present throughout the bowel. Stomach appears within normal limits. No dilated loops of bowel. A normal appendix is present in the right lower quadrant (series 3, image 47). No focal bowel wall thickening or inflammatory changes.   Vascular/Lymphatic: No significant vascular findings are evident on non contrasted study. No enlarged abdominal or pelvic lymph nodes.   Reproductive: Uterus and bilateral adnexa are within normal limits.   Other: No free fluid. No abdominopelvic fluid collection. No pneumoperitoneum. No abdominal wall hernia.   Musculoskeletal: No acute or significant osseous findings.   IMPRESSION: No acute abdominopelvic findings. Specifically, no evidence of obstructive uropathy.    Past Medical History:  Diagnosis Date   Barrett's esophagus    Fibromyalgia    Fibromyalgia    Gastritis    Perforated nasal septum    Past Surgical History:  Procedure Laterality Date   HERNIA REPAIR     SEPTOPLASTY     TONSILLECTOMY     WISDOM TOOTH EXTRACTION     Social History: She is married.  She is a Dance movement psychotherapist, Careers adviser.  Non-smoker.  No alcohol use.  No drug use.  Family History: Maternal grandmother and maternal grandfather had lung cancer with metastasis thought to be related to the atomic bombings  [place] and [place].  Allergies  Allergen Reactions   Cephalexin Itching   Ciprofloxacin Other (See Comments)    Tendon pain    Clindamycin/Lincomycin Itching   Doxycycline Itching   Metronidazole Itching   Sulfa Antibiotics Itching and Nausea And Vomiting   Penicillins Rash   Shrimp (Diagnostic) Rash     Outpatient Encounter Medications as of 08/07/2020  Medication Sig   benzonatate (TESSALON) 100 MG capsule Take 100 mg by mouth at bedtime.   sucralfate (CARAFATE) 1 g tablet Take 1 tablet (1 g total) by mouth 4 (four) times daily  -  with meals and at bedtime.   [DISCONTINUED] aluminum-magnesium hydroxide-simethicone (MAALOX) 200-200-20 MG/5ML SUSP Take 30 mLs by mouth 4 (four) times daily -  before meals and at bedtime.   [DISCONTINUED] lidocaine (XYLOCAINE) 2 % solution Use as directed 15 mLs in the mouth or throat as needed for mouth pain.   [DISCONTINUED] omeprazole (PRILOSEC) 20 MG capsule Take 1 capsule (20 mg total) by mouth daily.   [DISCONTINUED] ondansetron (ZOFRAN ODT) 4 MG disintegrating tablet Take 1 tablet (4 mg total) by mouth every 8 (eight) hours as needed for nausea.   No facility-administered encounter medications on file as of 08/07/2020.    REVIEW OF SYSTEMS:  Gen: Denies fever, sweats or chills. No weight loss.  CV: Denies chest pain, palpitations or edema. Resp: + Sore throat. Denies cough, shortness of breath of hemoptysis.  GI: See HPI.   GU : + Excessive urination, increased urinary frequency MS: + Back pain and muscle aches. Derm: Denies rash, itchiness,  skin lesions or unhealing ulcers. Psych: Denies depression, anxiety, memory loss, suicidal ideation and confusion. Heme: Denies bruising, bleeding. Neuro:  Denies headaches, dizziness or paresthesias. Endo:  Denies any problems with DM, thyroid or adrenal function.   PHYSICAL EXAM: BP 118/68   Pulse (!) 58   Ht 4\' 11"  (1.499 m)   Wt 103 lb (46.7 kg)   BMI 20.80 kg/m  General: 50 year old female in no acute distress. Head: Normocephalic and atraumatic. Eyes:  Sclerae non-icteric, conjunctive pink. Ears: Normal auditory acuity. Mouth: Dentition intact. No ulcers or lesions.  Neck: Supple, no lymphadenopathy or thyromegaly.  Lungs: Clear bilaterally to auscultation without wheezes, crackles or rhonchi. Heart: Regular rate and rhythm. No murmur, rub or gallop appreciated.  Abdomen: Soft, nontender, non distended. No masses. No hepatosplenomegaly. Normoactive bowel sounds x 4 quadrants.  Rectal: Deferred. Musculoskeletal:  Symmetrical with no gross deformities. Skin: Warm and dry. No rash or lesions on visible extremities. Extremities: No edema. Neurological: Alert oriented x 4, no focal deficits.  Psychological:  Alert and cooperative. Normal mood and affect.  ASSESSMENT AND PLAN:  61.  50 year old female with a history of Barrett's esophagus per EGD 06/16/2018 at [facility] presents epigastric and left upper quadrant abdominal pain.  CTAP 06/14/2020 was negative.  Normal LFTs and lipase level. -Request 05/2018 EGD biopsy results from [facility] -EGD benefits and risks discussed including risk with sedation, risk of bleeding, perforation and infection  -I discussed scheduling an abdominal sonogram to evaluate the gallbladder if her EGD is normal -Patient to call me with the name of acid reducing medication she is taking, okay to continue Carafate 1 g p.o. twice daily until current prescriptions runs out -GERD diet handout   2.  Colon cancer screen -Screening colonoscopy recommended, patient declined for now  Further recommendation to be determined after the above evaluation completed     CC:  Henderly, Britni A, PA-C

## 2020-08-11 ENCOUNTER — Encounter: Payer: Self-pay | Admitting: Gastroenterology

## 2020-08-11 ENCOUNTER — Other Ambulatory Visit: Payer: Self-pay

## 2020-08-11 ENCOUNTER — Ambulatory Visit (AMBULATORY_SURGERY_CENTER): Payer: 59 | Admitting: Gastroenterology

## 2020-08-11 VITALS — BP 126/87 | HR 73 | Temp 98.9°F | Resp 12 | Ht 59.0 in | Wt 103.0 lb

## 2020-08-11 DIAGNOSIS — K219 Gastro-esophageal reflux disease without esophagitis: Secondary | ICD-10-CM

## 2020-08-11 DIAGNOSIS — K2970 Gastritis, unspecified, without bleeding: Secondary | ICD-10-CM | POA: Diagnosis not present

## 2020-08-11 DIAGNOSIS — K2990 Gastroduodenitis, unspecified, without bleeding: Secondary | ICD-10-CM | POA: Diagnosis not present

## 2020-08-11 DIAGNOSIS — K222 Esophageal obstruction: Secondary | ICD-10-CM

## 2020-08-11 DIAGNOSIS — R1013 Epigastric pain: Secondary | ICD-10-CM

## 2020-08-11 MED ORDER — SODIUM CHLORIDE 0.9 % IV SOLN: 500.0000 mL | Freq: Once | INTRAVENOUS | Status: DC

## 2020-08-11 NOTE — Progress Notes (Signed)
Pt's states no medical or surgical changes since previsit or office visit. 

## 2020-08-11 NOTE — Progress Notes (Signed)
Called to room to assist during endoscopic procedure.  Patient ID and intended procedure confirmed with present staff. Received instructions for my participation in the procedure from the performing physician.  

## 2020-08-11 NOTE — Patient Instructions (Addendum)
Handout given:  gastritis Resume previous diet Continue current medications Await pathology results   YOU HAD AN ENDOSCOPIC PROCEDURE TODAY AT THE [facility]:   Refer to the procedure report that was given to you for any specific questions about what was found during the examination.  If the procedure report does not answer your questions, please call your gastroenterologist to clarify.  If you requested that your care partner not be given the details of your procedure findings, then the procedure report has been included in a sealed envelope for you to review at your convenience later.  YOU SHOULD EXPECT: Some feelings of bloating in the abdomen. Passage of more gas than usual.  Walking can help get rid of the air that was put into your GI tract during the procedure and reduce the bloating. If you had a lower endoscopy (such as a colonoscopy or flexible sigmoidoscopy) you may notice spotting of blood in your stool or on the toilet paper. If you underwent a bowel prep for your procedure, you may not have a normal bowel movement for a few days.  Please Note:  You might notice some irritation and congestion in your nose or some drainage.  This is from the oxygen used during your procedure.  There is no need for concern and it should clear up in a day or so.  SYMPTOMS TO REPORT IMMEDIATELY:  Following upper endoscopy (EGD)  Vomiting of blood or coffee ground material  New chest pain or pain under the shoulder blades  Painful or persistently difficult swallowing  New shortness of breath  Fever of 100F or higher  Black, tarry-looking stools  For urgent or emergent issues, a gastroenterologist can be reached at any hour by calling [phone]. Do not use MyChart messaging for urgent concerns.   DIET:  We do recommend a small meal at first, but then you may proceed to your regular diet.  Drink plenty of fluids but you should avoid alcoholic beverages for 24 hours.  ACTIVITY:   You should plan to take it easy for the rest of today and you should NOT DRIVE or use heavy machinery until tomorrow (because of the sedation medicines used during the test).    FOLLOW UP: Our staff will call the number listed on your records 48-72 hours following your procedure to check on you and address any questions or concerns that you may have regarding the information given to you following your procedure. If we do not reach you, we will leave a message.  We will attempt to reach you two times.  During this call, we will ask if you have developed any symptoms of COVID 19. If you develop any symptoms (ie: fever, flu-like symptoms, shortness of breath, cough etc.) before then, please call [phone].  If you test positive for Covid 19 in the 2 weeks post procedure, please call and report this information to us.    If any biopsies were taken you will be contacted by phone or by letter within the next 1-3 weeks.  Please call us at [phone] if you have not heard about the biopsies in 3 weeks.   SIGNATURES/CONFIDENTIALITY: You and/or your care partner have signed paperwork which will be entered into your electronic medical record.  These signatures attest to the fact that that the information above on your After Visit Summary has been reviewed and is understood.  Full responsibility of the confidentiality of this discharge information lies with you and/or your care-partner.  

## 2020-08-11 NOTE — Progress Notes (Signed)
Agree with the assessment and plan as outlined by Colleen Kennedy-Smith, NP.   Mahiya Kercheval, DO, FACG [facility]   

## 2020-08-11 NOTE — Progress Notes (Signed)
To PACU, VSS. Report to [email or web address].tb 

## 2020-08-11 NOTE — Op Note (Signed)
[facility] Patient Name: Linda Mcbride Procedure Date: 08/11/2020 10:16 AM MRN: [id number] Endoscopist: Doristine Locks , MD Age: 50 Referring MD:  Date of Birth: 1970-06-15 Gender: Female Account #: [id number] Procedure:                Upper GI endoscopy Indications:              Epigastric abdominal pain, Abdominal pain in the                            left upper quadrant, Upper abdominal pain,                            Suspected esophageal reflux, Follow-up of Barrett's                            esophagus                           EGD in 05/2018 at [facility] reportedly                            demonstrated Barrett's Esophagus along with small                            hiatal hernia and gastritis. More recently with                            upper abdominal pain in the MEG and LUQ. Not                            tolerant to trial of omeprazole, but some                            improvement since starting Carafate and most                            recently addition of Pepcid. Medicines:                Monitored Anesthesia Care Procedure:                Pre-Anesthesia Assessment:                           - Prior to the procedure, a History and Physical                            was performed, and patient medications and                            allergies were reviewed. The patient's tolerance of                            previous anesthesia was also reviewed. The risks  and benefits of the procedure and the sedation                            options and risks were discussed with the patient.                            All questions were answered, and informed consent                            was obtained. Prior Anticoagulants: The patient has                            taken no previous anticoagulant or antiplatelet                            agents. ASA Grade Assessment: II - A patient with                             mild systemic disease. After reviewing the risks                            and benefits, the patient was deemed in                            satisfactory condition to undergo the procedure.                           After obtaining informed consent, the endoscope was                            passed under direct vision. Throughout the                            procedure, the patient's blood pressure, pulse, and                            oxygen saturations were monitored continuously. The                            GIF [id number] #[id number] was introduced through the                            mouth, and advanced to the second part of duodenum.                            The upper GI endoscopy was accomplished without                            difficulty. The patient tolerated the procedure                            well. Scope In: Scope Out: Findings:  The upper third of the esophagus and middle third                            of the esophagus were normal.                           A widely patent and non-obstructing Schatzki ring                            was found in the lower third of the esophagus.                           The Z-line was regular and was found 35 cm from the                            incisors. Due to the prior history of Barrett's                            Esophagus, biopsies were taken with a cold forceps                            for histology. Estimated blood loss was minimal.                           The gastroesophageal flap valve was visualized                            endoscopically and classified as Hill Grade II                            (fold present, opens with respiration).                           Localized mild inflammation characterized by                            congestion (edema) and erythema was found in the                            gastric antrum. Biopsies were taken with a cold                            forceps for  histology. Estimated blood loss was                            minimal.                           The gastric fundus, gastric body and gastric antrum                            were normal. Additional biopsies were taken with a  cold forceps for Helicobacter pylori testing.                            Estimated blood loss was minimal.                           The examined duodenum was normal. Complications:            No immediate complications. Estimated Blood Loss:     Estimated blood loss was minimal. Impression:               - Normal upper third of esophagus and middle third                            of esophagus.                           - Widely patent and non-obstructing Schatzki ring.                           - Z-line regular, 35 cm from the incisors. Biopsied.                           - Gastroesophageal flap valve classified as Hill                            Grade II (fold present, opens with respiration).                           - Gastritis. Biopsied.                           - Normal gastric fundus, gastric body and antrum.                            Biopsied.                           - Normal examined duodenum. Recommendation:           - Patient has a contact number available for                            emergencies. The signs and symptoms of potential                            delayed complications were discussed with the                            patient. Return to normal activities tomorrow.                            Written discharge instructions were provided to the                            patient.                           -  Resume previous diet.                           - Continue present medications.                           - Await pathology results. Doristine Locks, MD 08/11/2020 10:56:02 AM

## 2020-08-11 NOTE — Progress Notes (Signed)
Upon leaving and up to bathroom, pt c/o abd cramping, gave .25mg  levsin under the tongue. Pt still unsteady on her feet. Says she is frequently like this after propofol.

## 2020-08-13 ENCOUNTER — Telehealth: Payer: Self-pay | Admitting: *Deleted

## 2020-08-13 NOTE — Telephone Encounter (Signed)
Follow up call made. No voice mailbox set up 

## 2020-08-15 ENCOUNTER — Encounter: Payer: Self-pay | Admitting: Gastroenterology

## 2020-11-24 ENCOUNTER — Ambulatory Visit (HOSPITAL_BASED_OUTPATIENT_CLINIC_OR_DEPARTMENT_OTHER)
Admission: RE | Admit: 2020-11-24 | Discharge: 2020-11-24 | Disposition: A | Discharge status: Home / Self Care | Payer: 59 | Source: Ambulatory Visit | Attending: Nurse Practitioner | Admitting: Nurse Practitioner

## 2020-11-24 ENCOUNTER — Other Ambulatory Visit (HOSPITAL_BASED_OUTPATIENT_CLINIC_OR_DEPARTMENT_OTHER): Payer: Self-pay | Admitting: Nurse Practitioner

## 2020-11-24 ENCOUNTER — Other Ambulatory Visit: Payer: Self-pay

## 2020-11-24 DIAGNOSIS — R051 Acute cough: Secondary | ICD-10-CM | POA: Diagnosis present

## 2021-08-01 ENCOUNTER — Encounter (HOSPITAL_BASED_OUTPATIENT_CLINIC_OR_DEPARTMENT_OTHER): Payer: Self-pay | Admitting: Emergency Medicine

## 2021-08-01 ENCOUNTER — Other Ambulatory Visit: Payer: Self-pay

## 2021-08-01 ENCOUNTER — Emergency Department (HOSPITAL_BASED_OUTPATIENT_CLINIC_OR_DEPARTMENT_OTHER)
Admission: EM | Admit: 2021-08-01 | Discharge: 2021-08-01 | Disposition: A | Discharge status: Home / Self Care | Payer: 59 | Attending: Emergency Medicine | Admitting: Emergency Medicine

## 2021-08-01 DIAGNOSIS — Z5321 Procedure and treatment not carried out due to patient leaving prior to being seen by health care provider: Secondary | ICD-10-CM | POA: Insufficient documentation

## 2021-08-01 DIAGNOSIS — S8991XA Unspecified injury of right lower leg, initial encounter: Secondary | ICD-10-CM | POA: Diagnosis present

## 2021-08-01 DIAGNOSIS — W5551XA Bitten by raccoon, initial encounter: Secondary | ICD-10-CM | POA: Diagnosis not present

## 2021-08-01 DIAGNOSIS — S81811A Laceration without foreign body, right lower leg, initial encounter: Secondary | ICD-10-CM | POA: Diagnosis not present

## 2021-08-01 NOTE — ED Triage Notes (Signed)
Pt POV reports possible raccoon bite  appx 1830 today. Pt with laceration to right calf.

## 2021-08-02 ENCOUNTER — Other Ambulatory Visit: Payer: Self-pay

## 2021-08-02 ENCOUNTER — Encounter (HOSPITAL_BASED_OUTPATIENT_CLINIC_OR_DEPARTMENT_OTHER): Payer: Self-pay | Admitting: Emergency Medicine

## 2021-08-02 ENCOUNTER — Emergency Department (HOSPITAL_BASED_OUTPATIENT_CLINIC_OR_DEPARTMENT_OTHER)
Admission: EM | Admit: 2021-08-02 | Discharge: 2021-08-02 | Disposition: A | Discharge status: Home / Self Care | Payer: 59 | Attending: Emergency Medicine | Admitting: Emergency Medicine

## 2021-08-02 DIAGNOSIS — W5551XA Bitten by raccoon, initial encounter: Secondary | ICD-10-CM | POA: Insufficient documentation

## 2021-08-02 DIAGNOSIS — Z23 Encounter for immunization: Secondary | ICD-10-CM | POA: Diagnosis not present

## 2021-08-02 DIAGNOSIS — S81812A Laceration without foreign body, left lower leg, initial encounter: Secondary | ICD-10-CM | POA: Diagnosis present

## 2021-08-02 MED ORDER — RABIES VACCINE, PCEC IM SUSR
1.0000 mL | Freq: Once | INTRAMUSCULAR | Status: AC
  Administered 2021-08-02: 1 mL via INTRAMUSCULAR
  Filled 2021-08-02: qty 1

## 2021-08-02 MED ORDER — AMOXICILLIN-POT CLAVULANATE 875-125 MG PO TABS: 1.0000 | ORAL_TABLET | Freq: Two times a day (BID) | ORAL | 0 refills | Status: AC

## 2021-08-02 NOTE — ED Triage Notes (Signed)
Pt reports she was bit by a raccoon yesterday at 1830. Pt has bruising and shallow wound to R calf. Denies any changes in wound since yesterday. 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

## 2021-08-02 NOTE — Discharge Instructions (Addendum)
On Wednesday, you should receive the next dose of your rabies vaccination.  You may go to the [facility] health urgent care center in [place] to have this completed.  Take antibiotic as prescribed to help prevent infection.  If you develop redness, swelling, drainage, contact ER for reassessment.

## 2021-08-02 NOTE — ED Provider Notes (Signed)
[facility] EMERGENCY DEPARTMENT Provider Note   CSN: [id number] Arrival date & time: 08/02/21  1558     History  Chief Complaint  Patient presents with   Animal Bite    Linda Mcbride is a 51 y.o. female.  Presented to the emerge apartment due to concern for raccoon bite.  Bite occurred yesterday evening at about 6:30 PM.  Occurred to her right leg.  She states that after the injury she performed extensive cleaning, irrigated with water clean with soap.  Has not noted any redness or drainage or fevers.  She reports that 15 years ago she had an animal bite and received rabies immunoglobulin and vaccination.  After the initial injection, she had numb tingling sensation in her leg and was told that she may have GBS.  She states that she never had any formal neurologic testing such as lumbar puncture or formal neurology consult.  She further reports that she does remember completing the course of the vaccine and getting multiple doses at later dates.  She states that her tetanus was last updated in 2018.  She has extensive allergies to antibiotics, states that the only antibiotic that she can take without itching is azithromycin.  Reports allergy to penicillin is itching.  HPI     Home Medications Prior to Admission medications   Medication Sig Start Date End Date Taking? Authorizing Provider  amoxicillin-clavulanate (AUGMENTIN) 875-125 MG tablet Take 1 tablet by mouth every 12 (twelve) hours. 08/02/21  Yes Milagros Loll, MD  famotidine (PEPCID) 40 MG tablet Take 40 mg by mouth daily. 07/15/20   [provider]  sucralfate (CARAFATE) 1 g tablet Take 1 tablet (1 g total) by mouth 4 (four) times daily -  with meals and at bedtime. 06/14/20   Henderly, Britni A, PA-C      Allergies    Cephalexin, Ciprofloxacin, Clindamycin/lincomycin, Doxycycline, Metronidazole, Sulfa antibiotics, Penicillins, and Shrimp (diagnostic)    Review of Systems   Review of Systems  Skin:   Positive for wound.  All other systems reviewed and are negative.   Physical Exam Updated Vital Signs BP 128/84 (BP Location: Left Arm)   Pulse 81   Temp 99.2 F (37.3 C) (Oral)   Resp 18   LMP 07/31/2021   SpO2 95%  Physical Exam Vitals and nursing note reviewed.  Constitutional:      General: She is not in acute distress.    Appearance: She is well-developed.  HENT:     Head: Normocephalic and atraumatic.  Eyes:     Conjunctiva/sclera: Conjunctivae normal.  Cardiovascular:     Rate and Rhythm: Normal rate.     Pulses: Normal pulses.  Pulmonary:     Effort: Pulmonary effort is normal. No respiratory distress.  Musculoskeletal:     Cervical back: Neck supple.     Comments: Healing small superficial lacerations a couple cm in length to her mid lower lateral left leg, no surrounding erythema, no active bleeding, normal dp/pt pulses, normal sensation, normal joint rom  Skin:    General: Skin is warm and dry.     Capillary Refill: Capillary refill takes less than 2 seconds.  Neurological:     General: No focal deficit present.     Mental Status: She is alert.  Psychiatric:        Mood and Affect: Mood normal.     ED Results / Procedures / Treatments   Labs (all labs ordered are listed, but only abnormal results are displayed)  Labs Reviewed - No data to display  EKG None  Radiology No results found.  Procedures Procedures    Medications Ordered in ED Medications  rabies vaccine (RABAVERT) injection 1 mL (1 mL Intramuscular Given 08/02/21 1655)    ED Course/ Medical Decision Making/ A&P                           Medical Decision Making Risk Prescription drug management.   51 year old lady presenting to ER due to concern for raccoon bite.  The bite itself appears to be healing well at this time, there is no active bleeding.  Patient reports that she has completed rabies vaccination course previously.  She had reported possible reaction to the initial dose  of medication received.  I suspect she may have had some sort of reaction to the immunoglobulin that was administered.  Patient had stated concern for possible GI Barr syndrome however this was never confirmed by any diagnostic testing and patient does not recall ever seeing a neurologist.  I have reviewed ER note from 2007 for numb/tingly sensation and see no mention of GBS.  Therefore I feel it is safe for patient to receive updated rabies vaccination today.  I discussed the case with Baron Hamper, [place] epidemiologist on-call.  States the patient has already completed rabies course at any time, patient only needs rabies vaccine dose at 0 and 3, no rabies Ig indicated.  Patient agreeable, provided dose of rabies.  Her tetanus is up-to-date.  Regarding antibiotic prophylaxis, initially recommended Augmentin however patient reports that she has had itching to penicillin.  I reviewed her medication allergies and patient has a documented or reported allergy of itching to every possible antibiotic combination that I can find for penicillin allergic patients. Therefore I recommended that she take agumentin and try benadryl as needed if she has itching. Discussed return precautions and wound care. Advsided to report to [facility] on Wednesday for next rabies vaccine.    After the discussed management above, the patient was determined to be safe for discharge.  The patient was in agreement with this plan and all questions regarding their care were answered.  ED return precautions were discussed and the patient will return to the ED with any significant worsening of condition.         Final Clinical Impression(s) / ED Diagnoses Final diagnoses:  Raccoon bite, initial encounter    Rx / DC Orders ED Discharge Orders          Ordered    amoxicillin-clavulanate (AUGMENTIN) 875-125 MG tablet  Every 12 hours        08/02/21 1653              Milagros Loll, MD 08/02/21  1714

## 2021-08-05 ENCOUNTER — Ambulatory Visit (HOSPITAL_COMMUNITY): Payer: 59

## 2022-01-23 IMAGING — CT CT ABD-PELV W/O CM
2 of 4 series · 17 of 46 positions shown, 19 images · non-contrast
Comparison: CT 06/14/2018

CLINICAL DATA: Left flank pain, epigastric pain, frequent urination

EXAM:
CT ABDOMEN AND PELVIS WITHOUT CONTRAST
TECHNIQUE: Multidetector CT imaging of the abdomen and pelvis was performed
following the standard protocol without IV contrast.

[Series 3: a/p w/o 5mm · axial · non-contrast · 0.80mm/px · z∈[+676,+1031]mm · 14 of 79 slices shown, 16 images]
[im 4/79  soft-tissue]
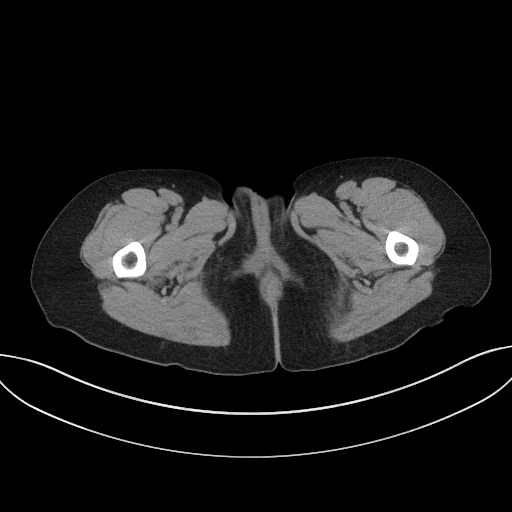
[im 4/79  bone]
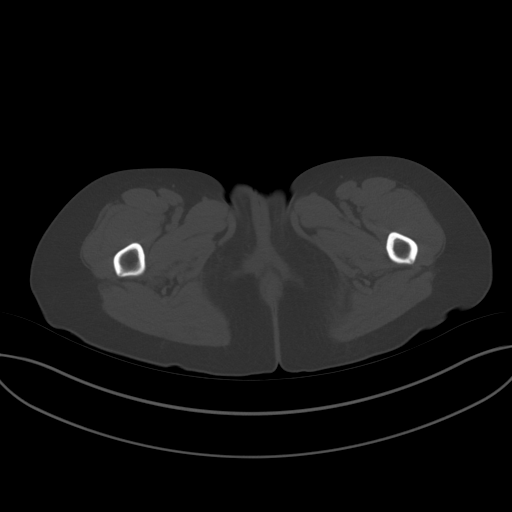
[im 10/79  soft-tissue]
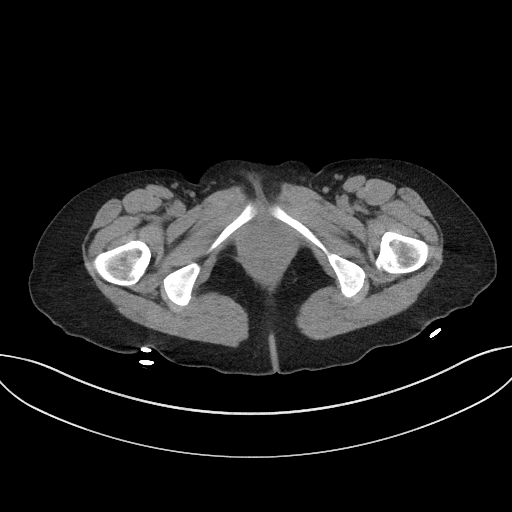
[im 16/79  soft-tissue]
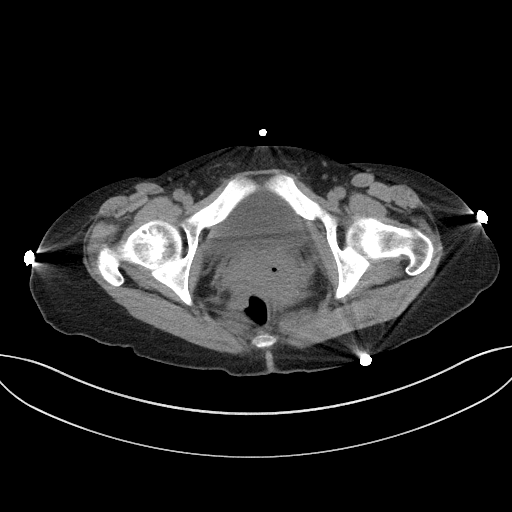
[im 22/79  soft-tissue]
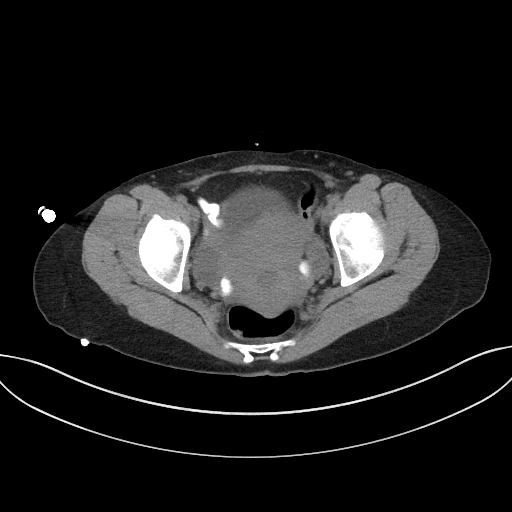
[im 25/79  soft-tissue]
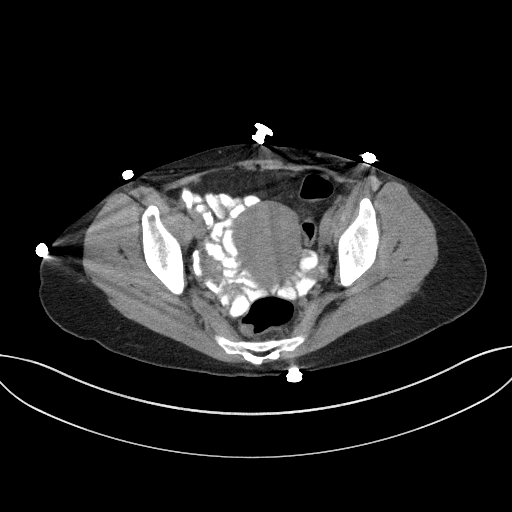
[im 32/79  soft-tissue]
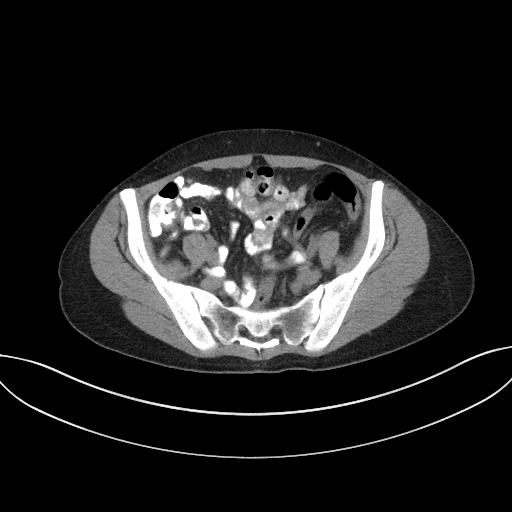
[im 38/79  soft-tissue]
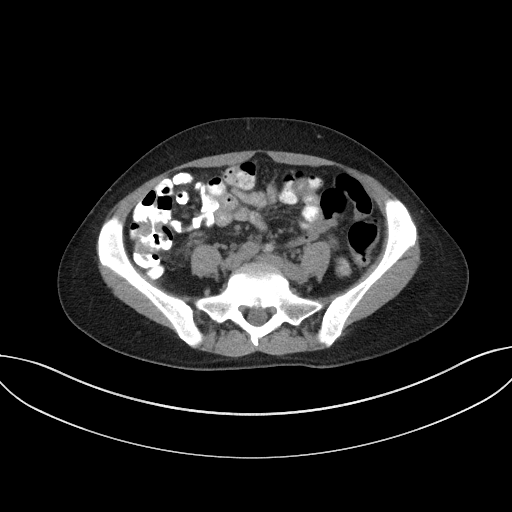
[im 41/79  soft-tissue]
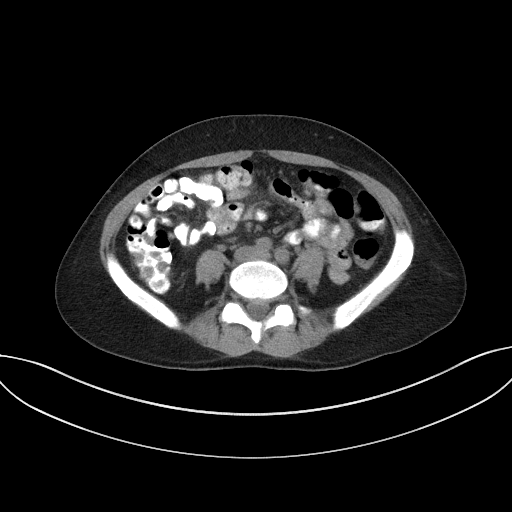
[im 47/79  soft-tissue]
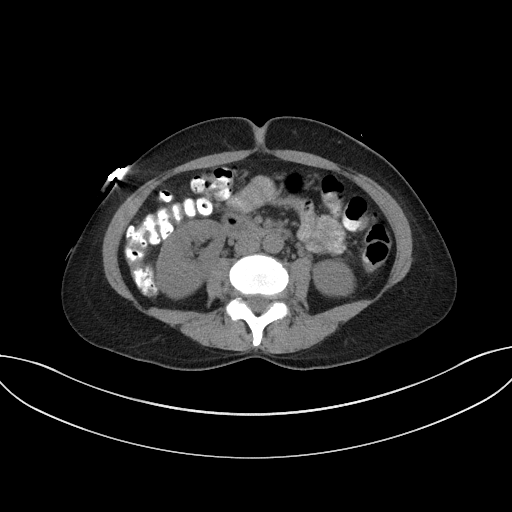
[im 47/79  bone]
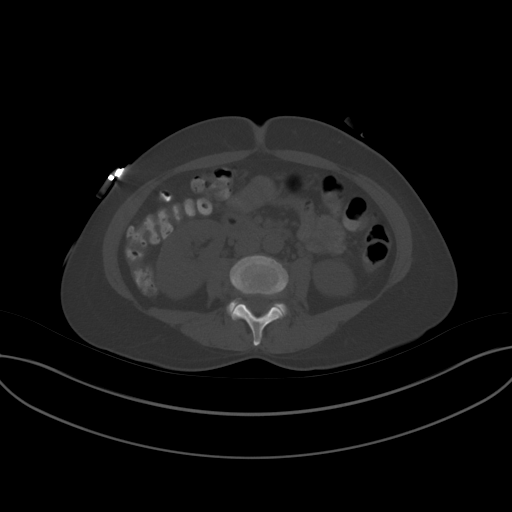
[im 54/79  soft-tissue]
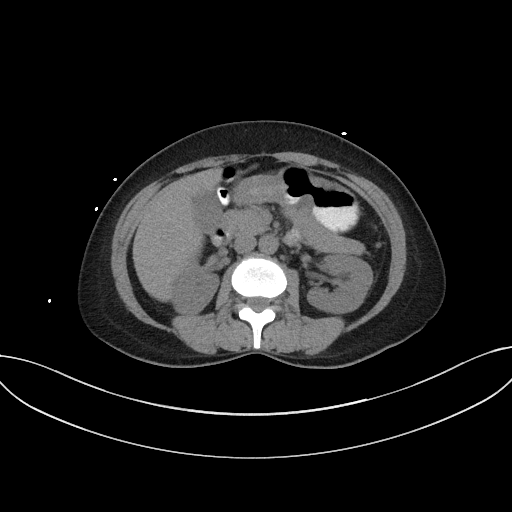
[im 60/79  soft-tissue]
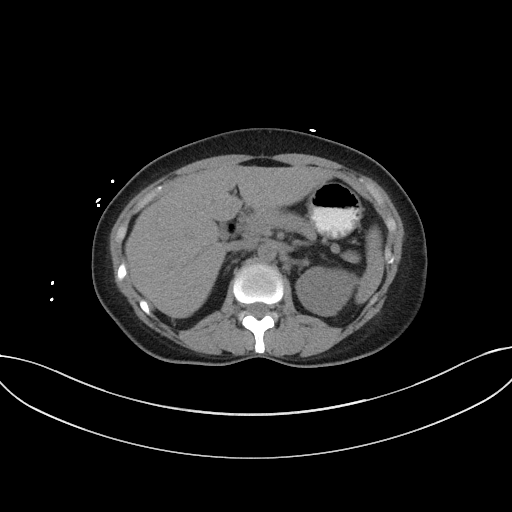
[im 63/79  soft-tissue]
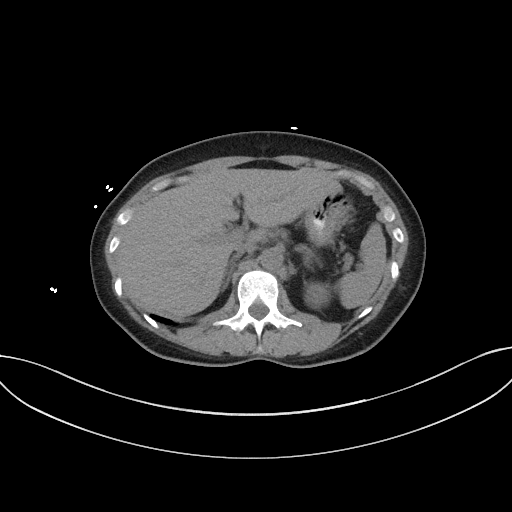
[im 69/79  soft-tissue]
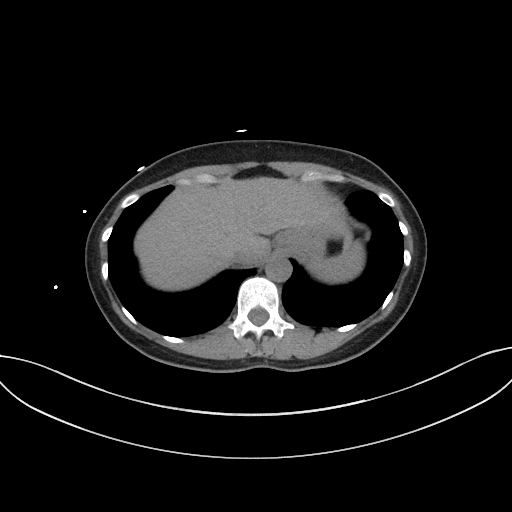
[im 75/79  soft-tissue]
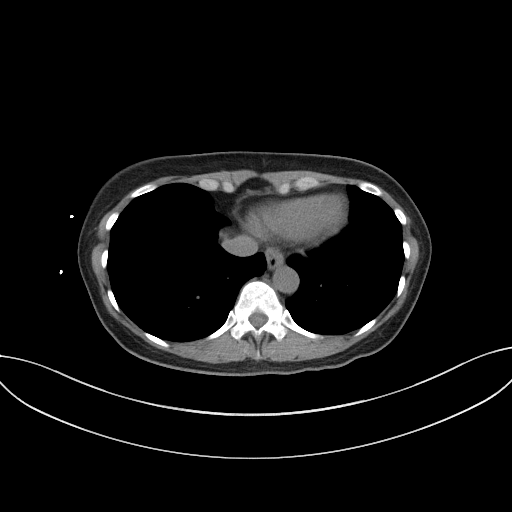

[Series 6: a/p w/o cor · coronal · non-contrast · 0.76mm/px · 3 of 123 slices shown]
[im 41/123  soft-tissue]
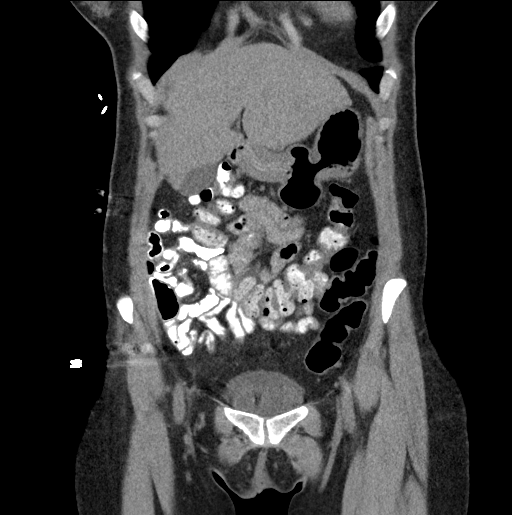
[im 55/123  soft-tissue]
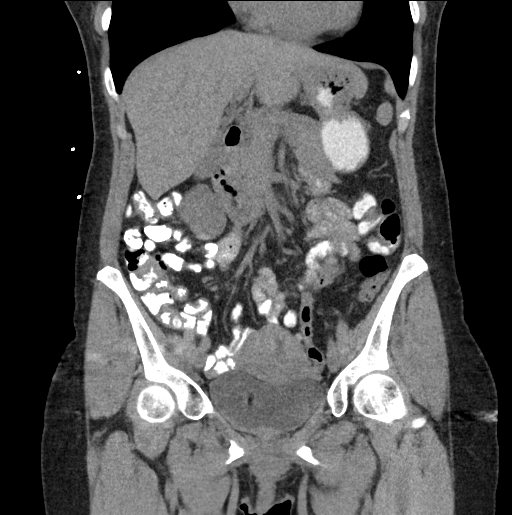
[im 68/123  soft-tissue]
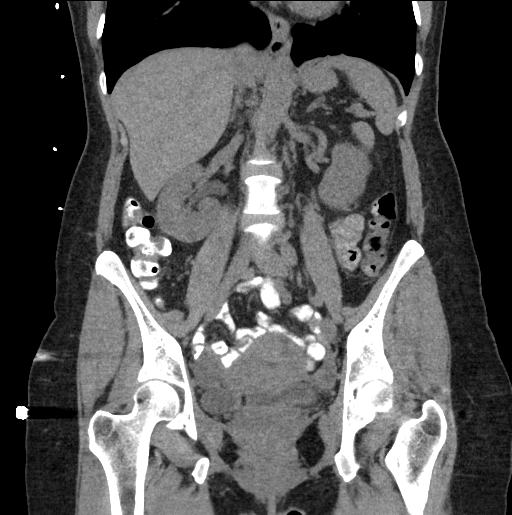

[17 of 46 positions shown; findings below may reference images not displayed]

FINDINGS: Lower chest: Included lung bases are clear.  Heart size is normal.

Hepatobiliary: Unremarkable unenhanced appearance of the liver. No
focal liver lesion identified. Gallbladder within normal limits. No
hyperdense gallstone. No biliary dilatation.

Pancreas: Unremarkable. No pancreatic ductal dilatation or
surrounding inflammatory changes.

Spleen: Normal in size without focal abnormality.

Adrenals/Urinary Tract: Unremarkable adrenal glands. Bilateral
kidneys have an unremarkable unenhanced appearance without evidence
of focal lesion, stone, or hydronephrosis. Ureters are nondilated.
No discrete ureteral stone is evident. Urinary bladder appears
unremarkable.

Stomach/Bowel: Oral contrast is present throughout the bowel.
Stomach appears within normal limits. No dilated loops of bowel. A
normal appendix is present in the right lower quadrant (series 3,
image 47). No focal bowel wall thickening or inflammatory changes.

Vascular/Lymphatic: No significant vascular findings are evident on
non contrasted study. No enlarged abdominal or pelvic lymph nodes.

Reproductive: Uterus and bilateral adnexa are within normal limits.

Other: No free fluid. No abdominopelvic fluid collection. No
pneumoperitoneum. No abdominal wall hernia.

Musculoskeletal: No acute or significant osseous findings.
IMPRESSION: No acute abdominopelvic findings. Specifically, no evidence of
obstructive uropathy.

## 2022-02-24 ENCOUNTER — Emergency Department (HOSPITAL_BASED_OUTPATIENT_CLINIC_OR_DEPARTMENT_OTHER)
Admission: EM | Admit: 2022-02-24 | Discharge: 2022-02-24 | Disposition: A | Discharge status: Home / Self Care | Payer: PRIVATE HEALTH INSURANCE | Attending: Emergency Medicine | Admitting: Emergency Medicine

## 2022-02-24 ENCOUNTER — Other Ambulatory Visit: Payer: Self-pay

## 2022-02-24 DIAGNOSIS — R1013 Epigastric pain: Secondary | ICD-10-CM | POA: Insufficient documentation

## 2022-02-24 DIAGNOSIS — R109 Unspecified abdominal pain: Secondary | ICD-10-CM | POA: Diagnosis present

## 2022-02-24 LAB — LIPASE, BLOOD: Lipase: 35 U/L (ref 11–51)

## 2022-02-24 LAB — COMPREHENSIVE METABOLIC PANEL
ALT: 12 U/L (ref 0–44)
AST: 19 U/L (ref 15–41)
Albumin: 3.9 g/dL (ref 3.5–5.0)
Alkaline Phosphatase: 47 U/L (ref 38–126)
Anion gap: 6 (ref 5–15)
BUN: 7 mg/dL (ref 6–20)
CO2: 24 mmol/L (ref 22–32)
Calcium: 8.9 mg/dL (ref 8.9–10.3)
Chloride: 105 mmol/L (ref 98–111)
Creatinine, Ser: 0.48 mg/dL (ref 0.44–1.00)
GFR, Estimated: >60 mL/min (ref >60)
Glucose, Bld: 103 mg/dL — ABNORMAL HIGH (ref 70–99)
Potassium: 3.5 mmol/L (ref 3.5–5.1)
Sodium: 135 mmol/L (ref 135–145)
Total Bilirubin: 0.8 mg/dL (ref 0.3–1.2)
Total Protein: 6.9 g/dL (ref 6.5–8.1)

## 2022-02-24 LAB — URINALYSIS, ROUTINE W REFLEX MICROSCOPIC
Bilirubin Urine: NEGATIVE
Glucose, UA: NEGATIVE mg/dL
Hgb urine dipstick: NEGATIVE
Ketones, ur: NEGATIVE mg/dL
Leukocytes,Ua: NEGATIVE
Nitrite: NEGATIVE
Protein, ur: NEGATIVE mg/dL
Specific Gravity, Urine: 1.025 (ref 1.005–1.030)
pH: 5 (ref 5.0–8.0)

## 2022-02-24 LAB — CBC WITH DIFFERENTIAL/PLATELET
Abs Immature Granulocytes: 0.01 10*3/uL (ref 0.00–0.07)
Basophils Absolute: 0 10*3/uL (ref 0.0–0.1)
Basophils Relative: 1 %
Eosinophils Absolute: 0.2 10*3/uL (ref 0.0–0.5)
Eosinophils Relative: 6 %
HCT: 36.8 % (ref 36.0–46.0)
Hemoglobin: 12.8 g/dL (ref 12.0–15.0)
Immature Granulocytes: 0 %
Lymphocytes Relative: 32 %
Lymphs Abs: 1.2 10*3/uL (ref 0.7–4.0)
MCH: 31.2 pg (ref 26.0–34.0)
MCHC: 34.8 g/dL (ref 30.0–36.0)
MCV: 89.8 fL (ref 80.0–100.0)
Monocytes Absolute: 0.5 10*3/uL (ref 0.1–1.0)
Monocytes Relative: 15 %
Neutro Abs: 1.7 10*3/uL (ref 1.7–7.7)
Neutrophils Relative %: 46 %
Platelets: 256 10*3/uL (ref 150–400)
RBC: 4.1 MIL/uL (ref 3.87–5.11)
RDW: 12 % (ref 11.5–15.5)
WBC: 3.6 10*3/uL — ABNORMAL LOW (ref 4.0–10.5)
nRBC: 0 % (ref 0.0–0.2)

## 2022-02-24 LAB — TROPONIN I (HIGH SENSITIVITY): Troponin I (High Sensitivity): <2 ng/L (ref <18)

## 2022-02-24 MED ORDER — PROCHLORPERAZINE EDISYLATE 10 MG/2ML IJ SOLN
10.0000 mg | Freq: Once | INTRAMUSCULAR | Status: AC
  Administered 2022-02-24: 10 mg via INTRAVENOUS
  Filled 2022-02-24: qty 2

## 2022-02-24 MED ORDER — FAMOTIDINE 20 MG PO TABS
20.0000 mg | ORAL_TABLET | Freq: Once | ORAL | Status: AC
  Administered 2022-02-24: 20 mg via ORAL
  Filled 2022-02-24: qty 1

## 2022-02-24 MED ORDER — ALUM & MAG HYDROXIDE-SIMETH 200-200-20 MG/5ML PO SUSP
30.0000 mL | Freq: Once | ORAL | Status: AC
  Administered 2022-02-24: 30 mL via ORAL
  Filled 2022-02-24: qty 30

## 2022-02-24 MED ORDER — PANTOPRAZOLE SODIUM 40 MG PO TBEC: 40.0000 mg | DELAYED_RELEASE_TABLET | Freq: Two times a day (BID) | ORAL | 0 refills | Status: AC

## 2022-02-24 MED ORDER — OXYCODONE-ACETAMINOPHEN 5-325 MG PO TABS
2.0000 | ORAL_TABLET | Freq: Once | ORAL | Status: AC
  Administered 2022-02-24: 2 via ORAL
  Filled 2022-02-24: qty 2

## 2022-02-24 MED ORDER — METOCLOPRAMIDE HCL 10 MG PO TABS: 10.0000 mg | ORAL_TABLET | Freq: Four times a day (QID) | ORAL | 0 refills | Status: AC | PRN

## 2022-02-24 MED ORDER — LIDOCAINE VISCOUS HCL 2 % MT SOLN
15.0000 mL | Freq: Once | OROMUCOSAL | Status: AC
  Administered 2022-02-24: 15 mL via ORAL
  Filled 2022-02-24: qty 15

## 2022-02-24 MED ORDER — OXYCODONE-ACETAMINOPHEN 5-325 MG PO TABS: 2.0000 | ORAL_TABLET | ORAL | 0 refills | Status: DC | PRN

## 2022-02-24 MED ORDER — PANTOPRAZOLE SODIUM 40 MG PO TBEC
40.0000 mg | DELAYED_RELEASE_TABLET | Freq: Once | ORAL | Status: AC
  Administered 2022-02-24: 40 mg via ORAL
  Filled 2022-02-24: qty 1

## 2022-02-24 MED ORDER — METOCLOPRAMIDE HCL 10 MG PO TABS
5.0000 mg | ORAL_TABLET | Freq: Once | ORAL | Status: AC
  Administered 2022-02-24: 5 mg via ORAL
  Filled 2022-02-24: qty 1

## 2022-02-24 NOTE — ED Provider Notes (Signed)
[facility] Provider Note   CSN: [id number] Arrival date & time: 02/24/22  0145     History  Chief Complaint  Patient presents with   Abdominal Pain    Linda Mcbride is a 52 y.o. female.  52 year old female history of gastritis that presents the ER today secondary to abdominal pain.  Patient states that she had gastritis on and off for years.  She is on Pepcid and Carafate at home which usually controls her symptoms.  She states last few nights she is woke up with pretty severe abdominal pain but generally is able to go back to sleep.  Happens almost always at night and when she lays flat.  She states that tonight same thing happened he just did not get any better so she came here for further evaluation.  She has not little bit of pain in her arms which she does not really understand that she has not had that before no shortness of breath she does have nausea but no vomiting.  No diaphoresis no lightheadedness.  Does not really radiate anywhere.  She does not drink a lot of alcohol, NSAIDs, fatty foods.  She had rice and vegetables for supper last night.  No diarrhea or constipation.  Had a CT scan recently that showed endometrial thickening and findings of fibroids.  She plans to follow-up with her gynecologist for that.  She has seen a gastroenterologist in the past and has had endoscopies.   Abdominal Pain      Home Medications Prior to Admission medications   Medication Sig Start Date End Date Taking? Authorizing Provider  metoCLOPramide (REGLAN) 10 MG tablet Take 1 tablet (10 mg total) by mouth every 6 (six) hours as needed for nausea (vomiting or abdominal pain refractory to toher meds). 02/24/22  Yes Autumnrose Yore, Corene Cornea, MD  oxyCODONE-acetaminophen (PERCOCET) 5-325 MG tablet Take 2 tablets by mouth every 4 (four) hours as needed. 02/24/22  Yes Jazlynn Nemetz, Corene Cornea, MD  pantoprazole (PROTONIX) 40 MG tablet Take 1 tablet (40 mg total) by mouth 2 (two)  times daily. 02/24/22  Yes Jasani Lengel, Corene Cornea, MD  amoxicillin-clavulanate (AUGMENTIN) 875-125 MG tablet Take 1 tablet by mouth every 12 (twelve) hours. 08/02/21   Lucrezia Starch, MD  famotidine (PEPCID) 40 MG tablet Take 40 mg by mouth daily. 07/15/20   [provider]  sucralfate (CARAFATE) 1 g tablet Take 1 tablet (1 g total) by mouth 4 (four) times daily -  with meals and at bedtime. 06/14/20   Henderly, Britni A, PA-C      Allergies    Cephalexin, Ciprofloxacin, Clindamycin/lincomycin, Doxycycline, Metronidazole, Sulfa antibiotics, Penicillins, and Shrimp (diagnostic)    Review of Systems   Review of Systems  Gastrointestinal:  Positive for abdominal pain.    Physical Exam Updated Vital Signs BP 103/71 (BP Location: Right Arm)   Pulse 65   Temp (!) 97.5 F (36.4 C) (Oral)   Resp 18   SpO2 99%  Physical Exam Vitals and nursing note reviewed.  Constitutional:      Appearance: She is well-developed.  HENT:     Head: Normocephalic and atraumatic.  Cardiovascular:     Rate and Rhythm: Normal rate and regular rhythm.  Pulmonary:     Effort: No respiratory distress.     Breath sounds: No stridor.  Abdominal:     General: Bowel sounds are normal. There is no distension.     Palpations: Abdomen is soft.     Tenderness: There  is abdominal tenderness in the epigastric area.  Musculoskeletal:     Cervical back: Normal range of motion.  Skin:    General: Skin is warm and dry.  Neurological:     Mental Status: She is alert.     ED Results / Procedures / Treatments   Labs (all labs ordered are listed, but only abnormal results are displayed) Labs Reviewed  CBC WITH DIFFERENTIAL/PLATELET - Abnormal; Notable for the following components:      Result Value   WBC 3.6 (*)    All other components within normal limits  COMPREHENSIVE METABOLIC PANEL - Abnormal; Notable for the following components:   Glucose, Bld 103 (*)    All other components within normal limits  LIPASE,  BLOOD  URINALYSIS, ROUTINE W REFLEX MICROSCOPIC  TROPONIN I (HIGH SENSITIVITY)    EKG None  Radiology No results found.  Procedures Procedures    Medications Ordered in ED Medications  alum & mag hydroxide-simeth (MAALOX/MYLANTA) 200-200-20 MG/5ML suspension 30 mL (30 mLs Oral Given 02/24/22 0307)    And  lidocaine (XYLOCAINE) 2 % viscous mouth solution 15 mL (15 mLs Oral Given 02/24/22 0307)  pantoprazole (PROTONIX) EC tablet 40 mg (40 mg Oral Given 02/24/22 0306)  oxyCODONE-acetaminophen (PERCOCET/ROXICET) 5-325 MG per tablet 2 tablet (2 tablets Oral Given 02/24/22 0405)  famotidine (PEPCID) tablet 20 mg (20 mg Oral Given 02/24/22 0405)  metoCLOPramide (REGLAN) tablet 5 mg (5 mg Oral Given 02/24/22 0406)  prochlorperazine (COMPAZINE) injection 10 mg (10 mg Intravenous Given 02/24/22 0545)    ED Course/ Medical Decision Making/ A&P                             Medical Decision Making Amount and/or Complexity of Data Reviewed Labs: ordered.  Risk OTC drugs. Prescription drug management.  Presentation seems consistent with gastritis versus peptic ulcer disease.  Will treat for the same.  Will check basic labs to evaluate for any hepatobiliary disorders, electrolyte disturbances or pancreatitis.  I do need to repeat a CT scan she just had 1 recently and she had a few in the past however if can get her symptoms better may need to do that.  Will start with GI cocktail and Protonix and see if that helps.  Not much relief with those.  She is asking if I can do an endoscopy.  I discussed that there were very rare indications for an emergent endoscopy and this was not one of them.  She understands and wishes to try some different medications.  I discussed with her if this did not work we might have to do a CT scan to make sure that the gallbladder and other organs are okay.  Feels better. Mild persistent nausea improved with compazine. Will fu w/ GI and PCP as needed. Start PPI at home.     Final Clinical Impression(s) / ED Diagnoses Final diagnoses:  Epigastric pain    Rx / DC Orders ED Discharge Orders          Ordered    metoCLOPramide (REGLAN) 10 MG tablet  Every 6 hours PRN        02/24/22 0516    oxyCODONE-acetaminophen (PERCOCET) 5-325 MG tablet  Every 4 hours PRN        02/24/22 0516    pantoprazole (PROTONIX) 40 MG tablet  2 times daily        02/24/22 0516    Ambulatory referral to Gastroenterology  02/24/22 8828              Merrily Pew, MD 02/24/22 [phone]

## 2022-02-24 NOTE — ED Triage Notes (Signed)
Pt with left and center abdominal pain as well as right groin pain. Reports hx of "chronic gastritis" but states her meds are not working. No n/v but some diarrhea.  More frequent urination.  No fever/chills.

## 2022-02-24 NOTE — ED Notes (Signed)
ED Provider at bedside. 

## 2022-07-05 IMAGING — DX DG CHEST 2V
2 series · 2 of 2 positions shown · non-contrast
Comparison: X-ray chest 05/31/2017.

CLINICAL DATA: Acute cough; technologist note states cough over 1
week.

EXAM:
CHEST - 2 VIEW

[chest pa]
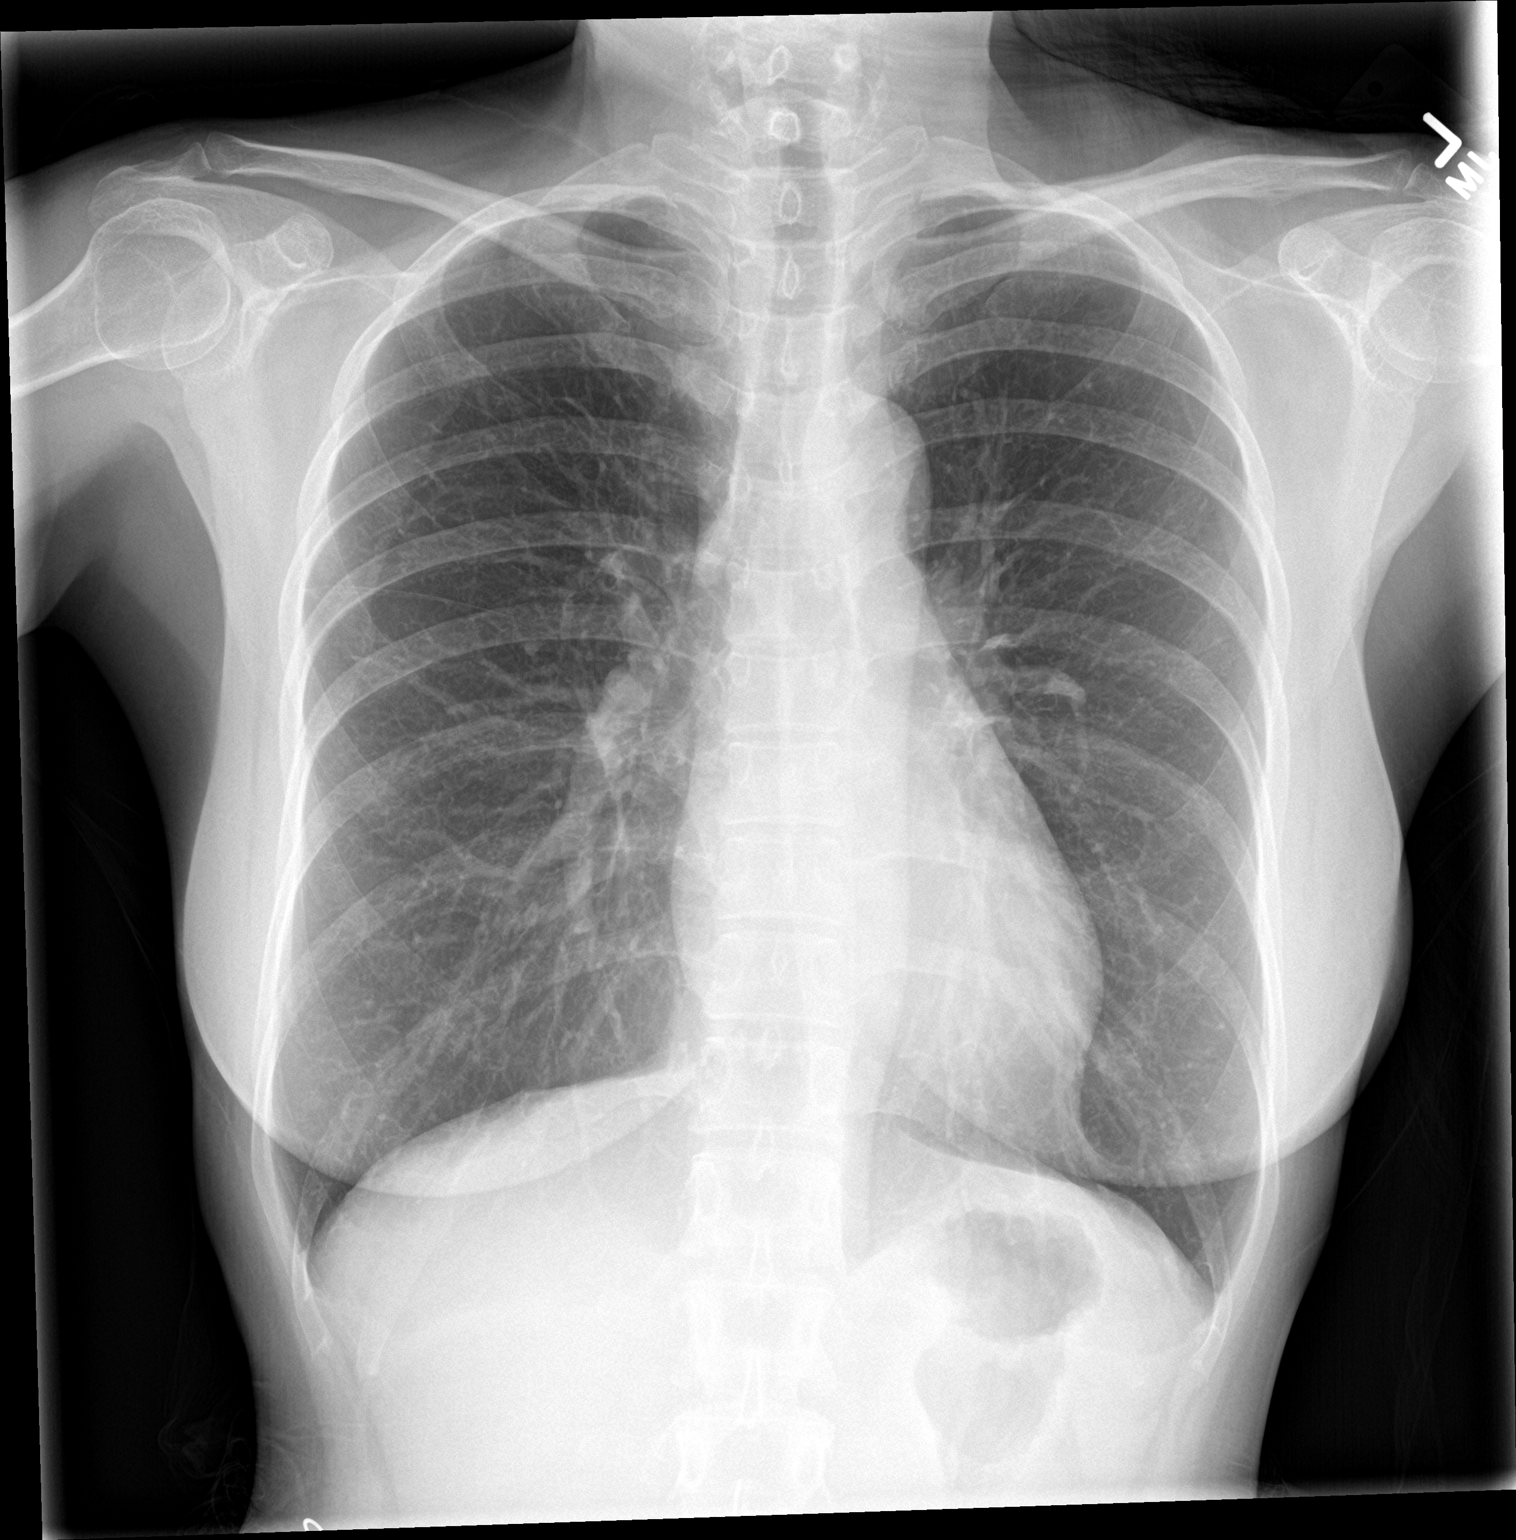

[chest lat]
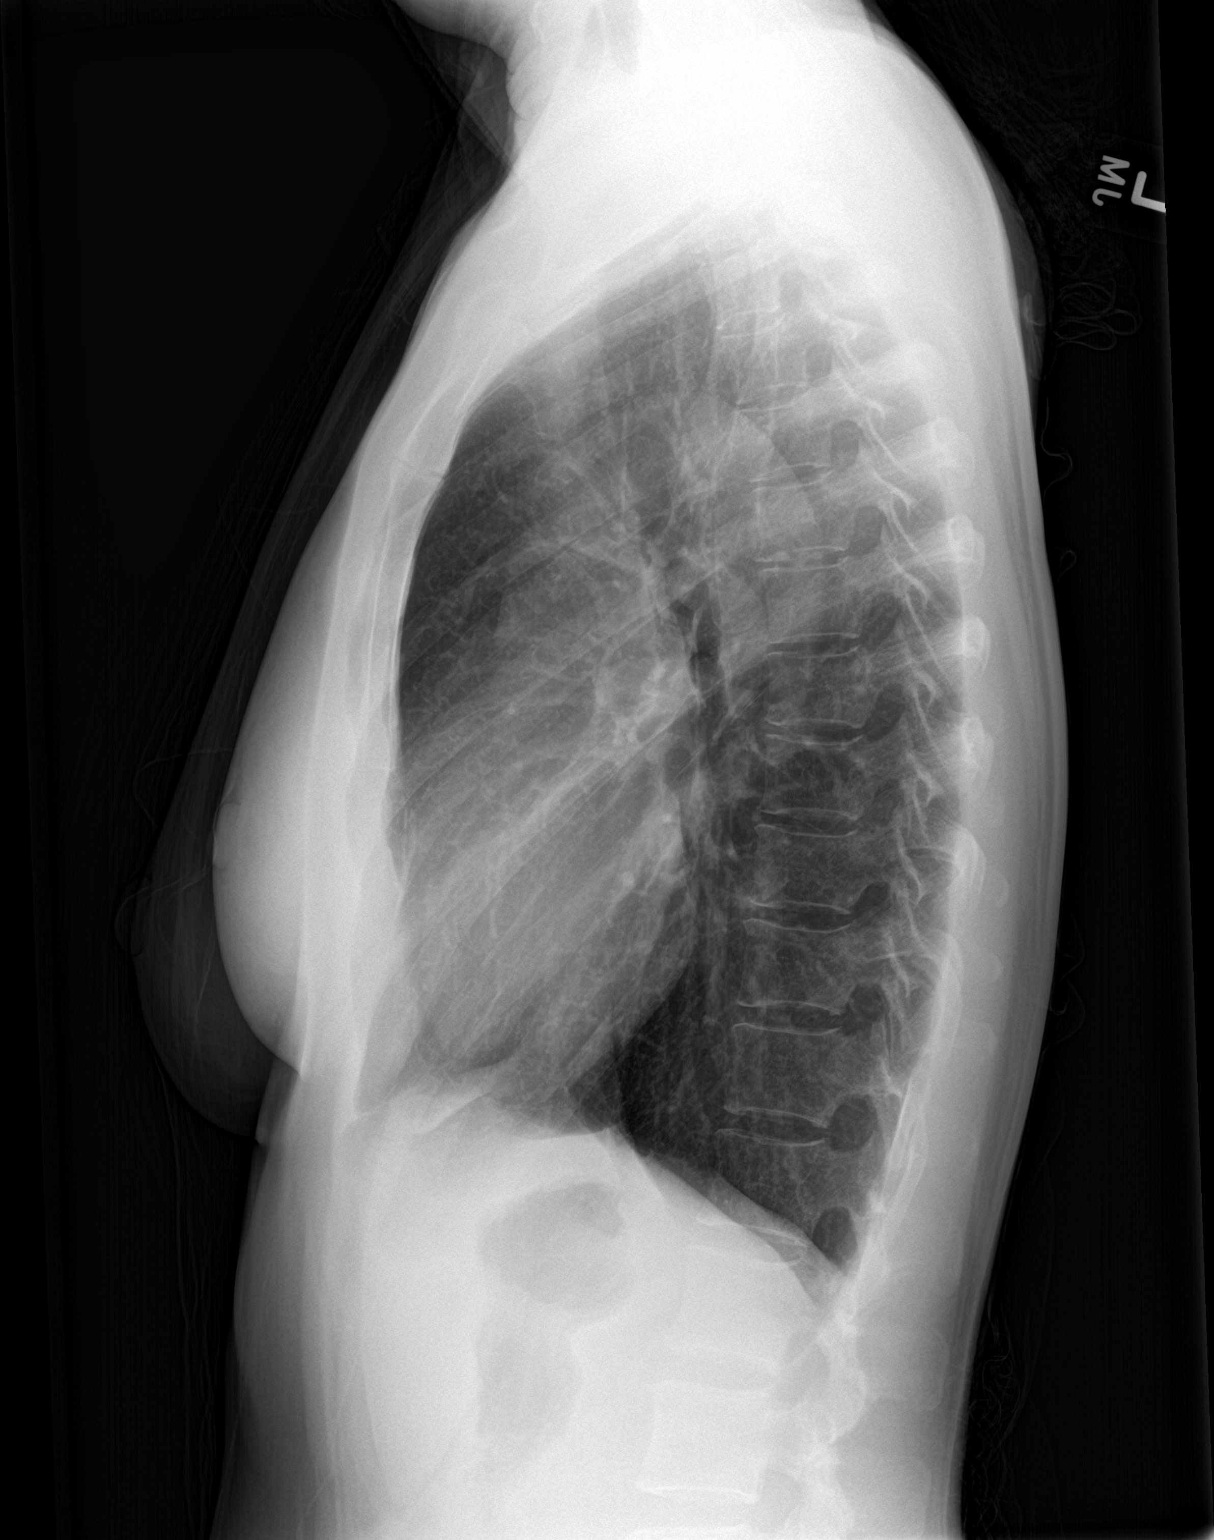

[2 of 2 positions shown; findings below may reference images not displayed]

FINDINGS: The heart size and mediastinal contours are within normal limits.
Both lungs are clear. No pleural effusion. The visualized skeletal
structures are unremarkable.
IMPRESSION: No active cardiopulmonary disease.

## 2023-03-15 ENCOUNTER — Emergency Department (HOSPITAL_COMMUNITY)
Admission: EM | Admit: 2023-03-15 | Discharge: 2023-03-15 | Disposition: A | Discharge status: Home / Self Care | Payer: Self-pay | Attending: Emergency Medicine | Admitting: Emergency Medicine

## 2023-03-15 ENCOUNTER — Encounter (HOSPITAL_COMMUNITY): Payer: Self-pay

## 2023-03-15 ENCOUNTER — Emergency Department (HOSPITAL_COMMUNITY): Payer: Self-pay

## 2023-03-15 ENCOUNTER — Other Ambulatory Visit: Payer: Self-pay

## 2023-03-15 DIAGNOSIS — R1030 Lower abdominal pain, unspecified: Secondary | ICD-10-CM

## 2023-03-15 DIAGNOSIS — R110 Nausea: Secondary | ICD-10-CM | POA: Insufficient documentation

## 2023-03-15 DIAGNOSIS — R1032 Left lower quadrant pain: Secondary | ICD-10-CM | POA: Insufficient documentation

## 2023-03-15 DIAGNOSIS — R1031 Right lower quadrant pain: Secondary | ICD-10-CM | POA: Insufficient documentation

## 2023-03-15 LAB — CBC
HCT: 39.3 % (ref 36.0–46.0)
Hemoglobin: 13.1 g/dL (ref 12.0–15.0)
MCH: 31.6 pg (ref 26.0–34.0)
MCHC: 33.3 g/dL (ref 30.0–36.0)
MCV: 94.7 fL (ref 80.0–100.0)
Platelets: 271 10*3/uL (ref 150–400)
RBC: 4.15 MIL/uL (ref 3.87–5.11)
RDW: 11.9 % (ref 11.5–15.5)
WBC: 4.5 10*3/uL (ref 4.0–10.5)
nRBC: 0 % (ref 0.0–0.2)

## 2023-03-15 LAB — HCG, SERUM, QUALITATIVE: Preg, Serum: NEGATIVE

## 2023-03-15 LAB — URINALYSIS, ROUTINE W REFLEX MICROSCOPIC
Bilirubin Urine: NEGATIVE
Glucose, UA: NEGATIVE mg/dL
Hgb urine dipstick: NEGATIVE
Ketones, ur: NEGATIVE mg/dL
Leukocytes,Ua: NEGATIVE
Nitrite: NEGATIVE
Protein, ur: NEGATIVE mg/dL
Specific Gravity, Urine: 1.008 (ref 1.005–1.030)
pH: 6 (ref 5.0–8.0)

## 2023-03-15 LAB — COMPREHENSIVE METABOLIC PANEL
ALT: 12 U/L (ref 0–44)
AST: 18 U/L (ref 15–41)
Albumin: 3.7 g/dL (ref 3.5–5.0)
Alkaline Phosphatase: 39 U/L (ref 38–126)
Anion gap: 11 (ref 5–15)
BUN: 8 mg/dL (ref 6–20)
CO2: 22 mmol/L (ref 22–32)
Calcium: 8.9 mg/dL (ref 8.9–10.3)
Chloride: 104 mmol/L (ref 98–111)
Creatinine, Ser: 0.65 mg/dL (ref 0.44–1.00)
GFR, Estimated: >60 mL/min (ref >60)
Glucose, Bld: 96 mg/dL (ref 70–99)
Potassium: 3.9 mmol/L (ref 3.5–5.1)
Sodium: 137 mmol/L (ref 135–145)
Total Bilirubin: 0.8 mg/dL (ref 0.0–1.2)
Total Protein: 6.8 g/dL (ref 6.5–8.1)

## 2023-03-15 LAB — LIPASE, BLOOD: Lipase: 28 U/L (ref 11–51)

## 2023-03-15 MED ORDER — OXYCODONE-ACETAMINOPHEN 5-325 MG PO TABS: 1.0000 | ORAL_TABLET | Freq: Four times a day (QID) | ORAL | 0 refills | Status: AC | PRN

## 2023-03-15 MED ORDER — KETOROLAC TROMETHAMINE 15 MG/ML IJ SOLN
15.0000 mg | Freq: Once | INTRAMUSCULAR | Status: AC
  Administered 2023-03-15: 15 mg via INTRAMUSCULAR
  Filled 2023-03-15: qty 1

## 2023-03-15 MED ORDER — OXYCODONE-ACETAMINOPHEN 5-325 MG PO TABS
1.0000 | ORAL_TABLET | Freq: Once | ORAL | Status: AC
  Administered 2023-03-15: 1 via ORAL
  Filled 2023-03-15: qty 1

## 2023-03-15 MED ORDER — KETOROLAC TROMETHAMINE 15 MG/ML IJ SOLN: 15.0000 mg | Freq: Once | INTRAMUSCULAR | Status: DC

## 2023-03-15 NOTE — ED Provider Triage Note (Signed)
 Emergency Medicine Provider Triage Evaluation Note  Linda Mcbride , a 53 y.o. female  was evaluated in triage.  Pt complains of left lower quadrant pain.   Patient with left lower quadrant pain x 2 weeks.  She reports that she has seen her chiropractor several times with minimal improvement in her symptoms after chiropractic adjustments.  She also complains of feeling constipated.    Review of Systems  Positive: Abdominal pain, constipation Negative: Fever, nausea, vomiting  Physical Exam  BP 115/79 (BP Location: Left Arm)   Pulse 84   Temp 98 F (36.7 C) (Oral)   Resp 18   Ht 4\' 11"  (1.499 m)   Wt 45.4 kg   LMP 03/01/2023   SpO2 99%   BMI 20.20 kg/m  Gen:   Awake, no distress   Resp:  Normal effort  MSK:   Moves extremities without difficulty  Other:  Tender with palpation of the left lower quadrant  Medical Decision Making  Medically screening exam initiated at 11:38 AM.  Appropriate orders placed.  Linda Mcbride was informed that the remainder of the evaluation will be completed by another provider, this initial triage assessment does not replace that evaluation, and the importance of remaining in the ED until their evaluation is complete.  Patient understands triage process and need to remain in the ED until completion of the workup.  Noncontrast CT abdomen ordered given history of IV contrast reaction.   Linda Fines, MD 03/15/23 [phone]

## 2023-03-15 NOTE — ED Provider Notes (Signed)
 [facility] AT [facility] Provider Note  CSN: [id number] Arrival date & time: 03/15/23 8469  Chief Complaint(s) Abdominal Pain  HPI Linda Mcbride is a 53 y.o. female History of fibromyalgia presenting to the emergency department with lower abdominal pain.  Patient reports lower abdominal pain for around 2 weeks.  Reports associated sensation of constipation but still having bowel movements.  Reports mild nausea, no vomiting.  No blood in stool.  No melena.  No painful urination.  No vaginal bleeding.   Past Medical History Past Medical History:  Diagnosis Date   Barrett's esophagus    Fibromyalgia    Fibromyalgia    Gastritis    Perforated nasal septum    Patient Active Problem List   Diagnosis Date Noted   Barrett's esophagus 08/07/2020   Abdominal pain, epigastric 08/07/2020   LUQ pain 08/07/2020   Fibromyalgia 07/12/2018   Thickened endometrium 06/23/2018   Pelvic pain 06/23/2018   Chronic cough 01/31/2018   Dyspnea 01/31/2018   Low grade squamous intraepithelial lesion (LGSIL) on cervical Pap smear 09/04/2013   Home Medication(s) Prior to Admission medications   Medication Sig Start Date End Date Taking? Authorizing Provider  amoxicillin-clavulanate (AUGMENTIN) 875-125 MG tablet Take 1 tablet by mouth every 12 (twelve) hours. 08/02/21   Milagros Loll, MD  famotidine (PEPCID) 40 MG tablet Take 40 mg by mouth daily. 07/15/20   [provider]  metoCLOPramide (REGLAN) 10 MG tablet Take 1 tablet (10 mg total) by mouth every 6 (six) hours as needed for nausea (vomiting or abdominal pain refractory to toher meds). 02/24/22   Mesner, Barbara Cower, MD  oxyCODONE-acetaminophen (PERCOCET) 5-325 MG tablet Take 1 tablet by mouth every 6 (six) hours as needed. 03/15/23   Lonell Grandchild, MD  pantoprazole (PROTONIX) 40 MG tablet Take 1 tablet (40 mg total) by mouth 2 (two) times daily. 02/24/22   Mesner, Barbara Cower, MD  sucralfate (CARAFATE) 1 g tablet  Take 1 tablet (1 g total) by mouth 4 (four) times daily -  with meals and at bedtime. 06/14/20   Henderly, Britni A, PA-C                                                                                                                                    Past Surgical History Past Surgical History:  Procedure Laterality Date   HERNIA REPAIR     SEPTOPLASTY     TONSILLECTOMY     WISDOM TOOTH EXTRACTION     Family History Family History  Problem Relation Age of Onset   Cancer Maternal Grandmother        lung, thyroid   Cancer Maternal Grandfather        lung and thyroid    Social History Social History   Tobacco Use   Smoking status: Never   Smokeless tobacco: Never  Substance Use Topics   Alcohol use: No   Drug use:  No   Allergies Cephalexin, Ciprofloxacin, Clindamycin/lincomycin, Doxycycline, Metronidazole, Sulfa antibiotics, Penicillins, and Shrimp (diagnostic)  Review of Systems Review of Systems  All other systems reviewed and are negative.   Physical Exam Vital Signs  I have reviewed the triage vital signs BP 122/70 (BP Location: Right Arm)   Pulse 65   Temp 97.9 F (36.6 C) (Oral)   Resp 16   Ht 4\' 11"  (1.499 m)   Wt 45.4 kg   LMP 03/01/2023   SpO2 100%   BMI 20.20 kg/m  Physical Exam Vitals and nursing note reviewed.  Constitutional:      General: She is not in acute distress.    Appearance: She is well-developed.  HENT:     Head: Normocephalic and atraumatic.     Mouth/Throat:     Mouth: Mucous membranes are moist.  Eyes:     Pupils: Pupils are equal, round, and reactive to light.  Cardiovascular:     Rate and Rhythm: Normal rate and regular rhythm.     Heart sounds: No murmur heard. Pulmonary:     Effort: Pulmonary effort is normal. No respiratory distress.     Breath sounds: Normal breath sounds.  Abdominal:     General: Abdomen is flat.     Palpations: Abdomen is soft.     Tenderness: There is abdominal tenderness in the right lower  quadrant, suprapubic area and left lower quadrant.  Musculoskeletal:        General: No tenderness.     Right lower leg: No edema.     Left lower leg: No edema.  Skin:    General: Skin is warm and dry.  Neurological:     General: No focal deficit present.     Mental Status: She is alert. Mental status is at baseline.  Psychiatric:        Mood and Affect: Mood normal.        Behavior: Behavior normal.     ED Results and Treatments Labs (all labs ordered are listed, but only abnormal results are displayed) Labs Reviewed  LIPASE, BLOOD  COMPREHENSIVE METABOLIC PANEL  CBC  URINALYSIS, ROUTINE W REFLEX MICROSCOPIC  HCG, SERUM, QUALITATIVE                                                                                                                          Radiology US Pelvis Complete Result Date: 03/15/2023 CLINICAL DATA:  53 year old with pelvic pain. EXAM: TRANSABDOMINAL AND TRANSVAGINAL ULTRASOUND OF PELVIS DOPPLER ULTRASOUND OF OVARIES TECHNIQUE: Both transabdominal and transvaginal ultrasound examinations of the pelvis were performed. Transabdominal technique was performed for global imaging of the pelvis including uterus, ovaries, adnexal regions, and pelvic cul-de-sac. It was necessary to proceed with endovaginal exam following the transabdominal exam to visualize the uterus, endometrium and adnexa. Color and duplex Doppler ultrasound was utilized to evaluate blood flow to the ovaries. COMPARISON:  Abdominopelvic CT earlier today FINDINGS: Uterus Measurements: 9.6 x 5.2 x 5.9 cm = volume:  153 mL. The uterus is anteverted. Heterogeneous myometrium with multiple fibroids. Exophytic 2.9 x 2.2 x 2.8 cm fibroid from the right fundus. Intramural 1.9 x 1.7 x 2.6 cm hypoechoic fibroid in the left anterior uterine body. Endometrium Thickness: 3 mm, normal.  No focal abnormality visualized. Right ovary Measurements: 1.4 x 1.1 x 1.5 cm = volume: 1.2 mL. Normal quiescent appearance. Ovarian  blood flow is seen. No adnexal mass. Left ovary Measurements: 2.2 x 1.5 x 2 cm = volume: 3.1 mL. 1.1 cm follicular cyst, physiologic. Normal ovarian blood flow. No adnexal mass. Pulsed Doppler evaluation of both ovaries demonstrates normal low-resistance arterial and venous waveforms. Other findings No abnormal free fluid. IMPRESSION: 1. Fibroid uterus.  Normal endometrium. 2. Normal sonographic appearance of the ovaries. Electronically Signed   By: Narda Rutherford M.D.   On: 03/15/2023 19:21   US Transvaginal Non-OB Result Date: 03/15/2023 CLINICAL DATA:  53 year old with pelvic pain. EXAM: TRANSABDOMINAL AND TRANSVAGINAL ULTRASOUND OF PELVIS DOPPLER ULTRASOUND OF OVARIES TECHNIQUE: Both transabdominal and transvaginal ultrasound examinations of the pelvis were performed. Transabdominal technique was performed for global imaging of the pelvis including uterus, ovaries, adnexal regions, and pelvic cul-de-sac. It was necessary to proceed with endovaginal exam following the transabdominal exam to visualize the uterus, endometrium and adnexa. Color and duplex Doppler ultrasound was utilized to evaluate blood flow to the ovaries. COMPARISON:  Abdominopelvic CT earlier today FINDINGS: Uterus Measurements: 9.6 x 5.2 x 5.9 cm = volume: 153 mL. The uterus is anteverted. Heterogeneous myometrium with multiple fibroids. Exophytic 2.9 x 2.2 x 2.8 cm fibroid from the right fundus. Intramural 1.9 x 1.7 x 2.6 cm hypoechoic fibroid in the left anterior uterine body. Endometrium Thickness: 3 mm, normal.  No focal abnormality visualized. Right ovary Measurements: 1.4 x 1.1 x 1.5 cm = volume: 1.2 mL. Normal quiescent appearance. Ovarian blood flow is seen. No adnexal mass. Left ovary Measurements: 2.2 x 1.5 x 2 cm = volume: 3.1 mL. 1.1 cm follicular cyst, physiologic. Normal ovarian blood flow. No adnexal mass. Pulsed Doppler evaluation of both ovaries demonstrates normal low-resistance arterial and venous waveforms. Other  findings No abnormal free fluid. IMPRESSION: 1. Fibroid uterus.  Normal endometrium. 2. Normal sonographic appearance of the ovaries. Electronically Signed   By: Narda Rutherford M.D.   On: 03/15/2023 19:21   [place] Art/Ven Flow Abd Pelv Doppler Result Date: 03/15/2023 CLINICAL DATA:  53 year old with pelvic pain. EXAM: TRANSABDOMINAL AND TRANSVAGINAL ULTRASOUND OF PELVIS DOPPLER ULTRASOUND OF OVARIES TECHNIQUE: Both transabdominal and transvaginal ultrasound examinations of the pelvis were performed. Transabdominal technique was performed for global imaging of the pelvis including uterus, ovaries, adnexal regions, and pelvic cul-de-sac. It was necessary to proceed with endovaginal exam following the transabdominal exam to visualize the uterus, endometrium and adnexa. Color and duplex Doppler ultrasound was utilized to evaluate blood flow to the ovaries. COMPARISON:  Abdominopelvic CT earlier today FINDINGS: Uterus Measurements: 9.6 x 5.2 x 5.9 cm = volume: 153 mL. The uterus is anteverted. Heterogeneous myometrium with multiple fibroids. Exophytic 2.9 x 2.2 x 2.8 cm fibroid from the right fundus. Intramural 1.9 x 1.7 x 2.6 cm hypoechoic fibroid in the left anterior uterine body. Endometrium Thickness: 3 mm, normal.  No focal abnormality visualized. Right ovary Measurements: 1.4 x 1.1 x 1.5 cm = volume: 1.2 mL. Normal quiescent appearance. Ovarian blood flow is seen. No adnexal mass. Left ovary Measurements: 2.2 x 1.5 x 2 cm = volume: 3.1 mL. 1.1 cm follicular cyst, physiologic. Normal ovarian blood flow. No  adnexal mass. Pulsed Doppler evaluation of both ovaries demonstrates normal low-resistance arterial and venous waveforms. Other findings No abnormal free fluid. IMPRESSION: 1. Fibroid uterus.  Normal endometrium. 2. Normal sonographic appearance of the ovaries. Electronically Signed   By: Narda Rutherford M.D.   On: 03/15/2023 19:21   CT ABDOMEN PELVIS WO CONTRAST Result Date: 03/15/2023 CLINICAL DATA:  Left  lower quadrant abdominal pain. EXAM: CT ABDOMEN AND PELVIS WITHOUT CONTRAST TECHNIQUE: Multidetector CT imaging of the abdomen and pelvis was performed following the standard protocol without IV contrast. RADIATION DOSE REDUCTION: This exam was performed according to the departmental dose-optimization program which includes automated exposure control, adjustment of the mA and/or kV according to patient size and/or use of iterative reconstruction technique. COMPARISON:  CT abdomen pelvis dated 06/14/2020. FINDINGS: Evaluation of this exam is limited in the absence of intravenous contrast. Lower chest: The visualized lung bases are clear. No intra-abdominal free air or free fluid. Hepatobiliary: The liver is unremarkable. No biliary dilatation. No calcified gallstone. Probable layering sludge in the gallbladder. Pancreas: Unremarkable. No pancreatic ductal dilatation or surrounding inflammatory changes. Spleen: Normal in size without focal abnormality. Adrenals/Urinary Tract: The adrenal glands unremarkable. There is no hydronephrosis or nephrolithiasis on either side. The visualized ureters and urinary bladder appear unremarkable. Stomach/Bowel: There is no bowel obstruction or active inflammation. The appendix is normal. Vascular/Lymphatic: The abdominal aorta and IVC are unremarkable on this noncontrast CT. No portal venous gas. There is no adenopathy. Reproductive: The uterus is anteverted. No suspicious adnexal masses. Other: None Musculoskeletal: No acute or significant osseous findings. IMPRESSION: 1. No acute intra-abdominal or pelvic pathology. No hydronephrosis or nephrolithiasis. 2. No bowel obstruction. Normal appendix. Electronically Signed   By: Elgie Collard M.D.   On: 03/15/2023 16:10    Pertinent labs & imaging results that were available during my care of the patient were reviewed by me and considered in my medical decision making (see MDM for details).  Medications Ordered in ED Medications   ketorolac (TORADOL) 15 MG/ML injection 15 mg (15 mg Intramuscular Given 03/15/23 1601)  oxyCODONE-acetaminophen (PERCOCET/ROXICET) 5-325 MG per tablet 1 tablet (1 tablet Oral Given 03/15/23 1851)                                                                                                                                     Procedures Procedures  (including critical care time)  Medical Decision Making / ED Course   MDM:  53 year old presenting to the emergency department with lower abdominal pain.  Patient also reports sensation of constipation.  Patient overall well-appearing, physical examination with lower abdominal tenderness.  Vital signs reassuring.  Differential includes diverticulitis, obstruction, perforation, volvulus, tumor, abscess,.  Will reassess.  Will treat symptoms with pain control.      Additional history obtained: -Additional history obtained from spouse -External records from outside source obtained and reviewed including: Chart review including previous notes, labs, imaging, consultation  notes including prior notes    Lab Tests: -I ordered, reviewed, and interpreted labs.   The pertinent results include:   Labs Reviewed  LIPASE, BLOOD  COMPREHENSIVE METABOLIC PANEL  CBC  URINALYSIS, ROUTINE W REFLEX MICROSCOPIC  HCG, SERUM, QUALITATIVE    Notable for normal results    Imaging Studies ordered: I ordered imaging studies including CT, [place]  On my interpretation imaging demonstrates fibroids I independently visualized and interpreted imaging. I agree with the radiologist interpretation   Medicines ordered and prescription drug management: Meds ordered this encounter  Medications   DISCONTD: ketorolac (TORADOL) 15 MG/ML injection 15 mg   ketorolac (TORADOL) 15 MG/ML injection 15 mg   oxyCODONE-acetaminophen (PERCOCET/ROXICET) 5-325 MG per tablet 1 tablet    Refill:  0   oxyCODONE-acetaminophen (PERCOCET) 5-325 MG tablet    Sig: Take 1 tablet  by mouth every 6 (six) hours as needed.    Dispense:  15 tablet    Refill:  0    -I have reviewed the patients home medicines and have made adjustments as needed    Reevaluation: After the interventions noted above, I reevaluated the patient and found that their symptoms have improved  Co morbidities that complicate the patient evaluation  Past Medical History:  Diagnosis Date   Barrett's esophagus    Fibromyalgia    Fibromyalgia    Gastritis    Perforated nasal septum       Dispostion: Disposition decision including need for hospitalization was considered, and patient discharged from emergency department.    Final Clinical Impression(s) / ED Diagnoses Final diagnoses:  Lower abdominal pain     This chart was dictated using voice recognition software.  Despite best efforts to proofread,  errors can occur which can change the documentation meaning.    Lonell Grandchild, MD 03/15/23 2045

## 2023-03-15 NOTE — Discharge Instructions (Signed)
 We evaluated you for your abdominal pain.  Your CT scan and your ultrasound were reassuring and did not show any dangerous or life-threatening conditions.  Your pelvic ultrasound did show some fibroids which may be the source of your pain.  We would recommend following up with your primary care physician and your gynecologist.  If you develop any new or worsening symptoms such as severe worsening pain, uncontrolled vomiting, bloody stools or black stools, fevers or chills, or any other concerning symptoms, please return to the emergency department.

## 2023-03-15 NOTE — ED Notes (Signed)
 Pt refuses to leave the BP cuff on and refuses to leave the pulse ox on stating "everything hurts." Pt refused to let this paramedic take BP and remove cuff.

## 2023-03-15 NOTE — ED Notes (Signed)
 Patient requested manual blood pressure.

## 2023-03-15 NOTE — ED Triage Notes (Signed)
 Pt to ED accompanied with husband c/o left lower abdominal pain and pelvic pain x 3 weeks, progressively getting worse. Denies vaginal symptoms. Reports last BM this morning pt concerned for constipation. Denies vomiting. Denies urinary symptoms.
